# Patient Record
Sex: Female | Born: 1995
Health system: Southern US, Community
[De-identification: ages and names within clinical notes are randomized; demographics above are authoritative.]

## PROBLEM LIST (undated history)

## (undated) DIAGNOSIS — M419 Scoliosis, unspecified: Secondary | ICD-10-CM

## (undated) DIAGNOSIS — K219 Gastro-esophageal reflux disease without esophagitis: Secondary | ICD-10-CM

## (undated) DIAGNOSIS — G43019 Migraine without aura, intractable, without status migrainosus: Secondary | ICD-10-CM

## (undated) DIAGNOSIS — J45909 Unspecified asthma, uncomplicated: Secondary | ICD-10-CM

## (undated) DIAGNOSIS — F319 Bipolar disorder, unspecified: Secondary | ICD-10-CM

## (undated) DIAGNOSIS — M1990 Unspecified osteoarthritis, unspecified site: Secondary | ICD-10-CM

## (undated) DIAGNOSIS — E538 Deficiency of other specified B group vitamins: Secondary | ICD-10-CM

## (undated) DIAGNOSIS — F32A Depression, unspecified: Secondary | ICD-10-CM

## (undated) DIAGNOSIS — F329 Major depressive disorder, single episode, unspecified: Secondary | ICD-10-CM

## (undated) DIAGNOSIS — K5090 Crohn's disease, unspecified, without complications: Secondary | ICD-10-CM

## (undated) DIAGNOSIS — F4310 Post-traumatic stress disorder, unspecified: Secondary | ICD-10-CM

## (undated) DIAGNOSIS — R55 Syncope and collapse: Secondary | ICD-10-CM

## (undated) DIAGNOSIS — F419 Anxiety disorder, unspecified: Secondary | ICD-10-CM

## (undated) HISTORY — DX: Post-traumatic stress disorder, unspecified: F43.10

## (undated) HISTORY — PX: OTHER SURGICAL HISTORY: SHX169

## (undated) HISTORY — PX: CHOLECYSTECTOMY: SHX55

## (undated) HISTORY — DX: Gastro-esophageal reflux disease without esophagitis: K21.9

## (undated) HISTORY — DX: Anxiety disorder, unspecified: F41.9

## (undated) HISTORY — DX: Depression, unspecified: F32.A

## (undated) HISTORY — DX: Syncope and collapse: R55

## (undated) HISTORY — PX: TUMOR REMOVAL: SHX12

---
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
## 2014-10-14 ENCOUNTER — Emergency Department
Admission: EM | Admit: 2014-10-14 | Discharge: 2014-10-14 | Disposition: A | Discharge status: Home / Self Care | Payer: Self-pay | Attending: Emergency Medicine | Admitting: Emergency Medicine

## 2014-10-14 ENCOUNTER — Encounter: Payer: Self-pay | Admitting: Emergency Medicine

## 2014-10-14 DIAGNOSIS — Z720 Tobacco use: Secondary | ICD-10-CM | POA: Insufficient documentation

## 2014-10-14 DIAGNOSIS — J020 Streptococcal pharyngitis: Secondary | ICD-10-CM | POA: Insufficient documentation

## 2014-10-14 MED ORDER — PREDNISONE 20 MG PO TABS
ORAL_TABLET | ORAL | Status: AC
  Administered 2014-10-14: 60 mg via ORAL
  Filled 2014-10-14: qty 3

## 2014-10-14 MED ORDER — MAGIC MOUTHWASH
10.0000 mL | Freq: Once | ORAL | Status: AC
  Administered 2014-10-14: 10 mL via ORAL
  Filled 2014-10-14: qty 10

## 2014-10-14 MED ORDER — AMOXICILLIN-POT CLAVULANATE 875-125 MG PO TABS
ORAL_TABLET | ORAL | Status: AC
  Administered 2014-10-14: 1 via ORAL
  Filled 2014-10-14: qty 1

## 2014-10-14 MED ORDER — AMOXICILLIN-POT CLAVULANATE 875-125 MG PO TABS
1.0000 | ORAL_TABLET | Freq: Once | ORAL | Status: AC
  Administered 2014-10-14: 1 via ORAL
  Filled 2014-10-14: qty 1

## 2014-10-14 MED ORDER — PREDNISONE 20 MG PO TABS
60.0000 mg | ORAL_TABLET | Freq: Once | ORAL | Status: AC
  Administered 2014-10-14: 60 mg via ORAL

## 2014-10-14 MED ORDER — PREDNISONE 10 MG PO TABS: 10.0000 mg | ORAL_TABLET | Freq: Once | ORAL | Status: DC

## 2014-10-14 MED ORDER — AMOXICILLIN-POT CLAVULANATE 875-125 MG PO TABS: 1.0000 | ORAL_TABLET | Freq: Two times a day (BID) | ORAL | Status: AC

## 2014-10-14 MED ORDER — MAGIC MOUTHWASH: 10.0000 mL | Freq: Three times a day (TID) | ORAL | Status: DC | PRN

## 2014-10-14 NOTE — ED Notes (Signed)
Pt arrived to the ED for complaints of "sore throat with white specs on it." Pt reports having a history of strep-throat. Pt is AOx4 in no apparent distress.

## 2014-10-14 NOTE — ED Provider Notes (Signed)
[facility] Emergency Department Provider Note     Time seen: ----------------------------------------- 8:17 PM on 10/14/2014 -----------------------------------------    I have reviewed the triage vital signs and the nursing notes.   HISTORY  Chief Complaint Sore Throat    HPI Kaitlin Nelson is a 19 y.o. female who presents to ER with complaints of sore throat with white specks on it for the past 3 days.Patient states she has had a fever, and it is too painful to swallow. She denies any other complaints.   Past Medical History  Diagnosis Date  . Strep throat     There are no active problems to display for this patient.   Past Surgical History  Procedure Laterality Date  . Cholecystectomy      Allergies Hydrocil  Social History Social History  Substance Use Topics  . Smoking status: Current Every Day Smoker -- 0.50 packs/day for 3 years    Types: Cigarettes  . Smokeless tobacco: None  . Alcohol Use: No    Review of Systems Constitutional: Positive for recent fever Eyes: Negative for visual changes. ENT: Positive for sore throat Cardiovascular: Negative for chest pain. Respiratory: Negative for shortness of breath. Gastrointestinal: Negative for abdominal pain, vomiting and diarrhea.   ____________________________________________   PHYSICAL EXAM:  VITAL SIGNS: ED Triage Vitals  Enc Vitals Group     BP 10/14/14 2010 119/69 mmHg     Pulse Rate 10/14/14 2010 97     Resp 10/14/14 2010 18     Temp 10/14/14 2010 99 F (37.2 C)     Temp Source 10/14/14 2010 Oral     SpO2 10/14/14 2010 100 %     Weight 10/14/14 2010 155 lb (70.308 kg)     Height 10/14/14 2010 5' 3"  (1.6 m)     Head Cir --      Peak Flow --      Pain Score 10/14/14 2011 7     Pain Loc --      Pain Edu? --      Excl. in [place]? --     Constitutional: Alert and oriented. Well appearing and in no distress. Eyes: Conjunctivae are normal. PERRL. Normal  extraocular movements. ENT   Head: Normocephalic and atraumatic.   Nose: No congestion/rhinnorhea.   Mouth/Throat: Moderate tonsillar hypertrophy with erythema and exudate.   Neck: Mild anterior cervical adenopathy Cardiovascular: Normal rate, regular rhythm. Normal and symmetric distal pulses are present in all extremities. No murmurs, rubs, or gallops. Respiratory: Normal respiratory effort without tachypnea nor retractions. Breath sounds are clear and equal bilaterally. No wheezes/rales/rhonchi. Neurologic:  Normal speech and language.  Skin:  Skin is warm, dry and intact. No rash noted. Psychiatric: Mood and affect are normal.  ____________________________________________  ED COURSE:  Pertinent labs & imaging results that were available during my care of the patient were reviewed by me and considered in my medical decision making (see chart for details). Patient clinically with strep pharyngitis. Will be given antibiotics and steroids and Magic mouthwash. ____________________________________________  FINAL ASSESSMENT AND PLAN  Strep pharyngitis  Plan: Patient with labs and imaging as dictated above. Patient being treated as dictated above. She is stable for follow-up as needed   Earleen Newport, MD   Earleen Newport, MD 10/14/14 2028

## 2014-10-14 NOTE — ED Notes (Signed)
MD at bedside. 

## 2014-10-14 NOTE — Discharge Instructions (Signed)

## 2016-12-03 ENCOUNTER — Encounter: Payer: Self-pay | Admitting: Emergency Medicine

## 2016-12-03 ENCOUNTER — Emergency Department
Admission: EM | Admit: 2016-12-03 | Discharge: 2016-12-03 | Disposition: A | Discharge status: Home / Self Care | Payer: Self-pay | Attending: Emergency Medicine | Admitting: Emergency Medicine

## 2016-12-03 ENCOUNTER — Emergency Department: Payer: Self-pay

## 2016-12-03 DIAGNOSIS — J40 Bronchitis, not specified as acute or chronic: Secondary | ICD-10-CM

## 2016-12-03 DIAGNOSIS — F17210 Nicotine dependence, cigarettes, uncomplicated: Secondary | ICD-10-CM | POA: Insufficient documentation

## 2016-12-03 DIAGNOSIS — R05 Cough: Secondary | ICD-10-CM

## 2016-12-03 DIAGNOSIS — R062 Wheezing: Secondary | ICD-10-CM | POA: Insufficient documentation

## 2016-12-03 DIAGNOSIS — J209 Acute bronchitis, unspecified: Secondary | ICD-10-CM | POA: Insufficient documentation

## 2016-12-03 DIAGNOSIS — R059 Cough, unspecified: Secondary | ICD-10-CM

## 2016-12-03 MED ORDER — ALBUTEROL SULFATE (2.5 MG/3ML) 0.083% IN NEBU
2.5000 mg | INHALATION_SOLUTION | Freq: Once | RESPIRATORY_TRACT | Status: AC
  Administered 2016-12-03: 2.5 mg via RESPIRATORY_TRACT
  Filled 2016-12-03: qty 3

## 2016-12-03 MED ORDER — GUAIFENESIN-CODEINE 100-10 MG/5ML PO SOLN: 10.0000 mL | Freq: Four times a day (QID) | ORAL | 0 refills | Status: DC | PRN

## 2016-12-03 MED ORDER — PREDNISONE 10 MG (21) PO TBPK: ORAL_TABLET | ORAL | 0 refills | Status: DC

## 2016-12-03 MED ORDER — DEXAMETHASONE SODIUM PHOSPHATE 10 MG/ML IJ SOLN
10.0000 mg | Freq: Once | INTRAMUSCULAR | Status: AC
  Administered 2016-12-03: 10 mg via INTRAMUSCULAR
  Filled 2016-12-03: qty 1

## 2016-12-03 MED ORDER — AZITHROMYCIN 250 MG PO TABS: ORAL_TABLET | ORAL | 0 refills | Status: AC

## 2016-12-03 NOTE — Discharge Instructions (Signed)
Take medication as prescribed. Return to emergency department if symptoms worsen and follow-up with PCP as needed.

## 2016-12-03 NOTE — ED Notes (Signed)
Pt presents with cough, nasal congestion, fever x 3 weeks. She has taken dayquil with no relief. Pt states her cough is productive with green-ish sputum. NAD noted.

## 2016-12-03 NOTE — ED Provider Notes (Signed)
[facility] Emergency Department Provider Note   ____________________________________________   I have reviewed the triage vital signs and the nursing notes.   HISTORY  Chief Complaint Cough; Fever; and Nasal Congestion    HPI Kaitlin Nelson is a 21 y.o. female presents emergency department with cough, nasal congestion, malaise, fever, chills, sinus pressure for approximately 3 weeks. Patient has attempted several over-the-counter medications without any relief or resolution of symptoms. Patient has missed several days or due to current symptoms and has decided to come to the emergency department for evaluation. Patient has a history of asthma however, she has not experienced an asthma attack in very long time. Patient works outside [employer] which occasionally can be an aggravating factor for her respiratory system. Patient denies headache, vision changes, chest pain, chest tightness, abdominal pain, nausea and vomiting.  Past Medical History:  Diagnosis Date  . Strep throat     There are no active problems to display for this patient.   Past Surgical History:  Procedure Laterality Date  . CHOLECYSTECTOMY      Prior to Admission medications   Medication Sig Start Date End Date Taking? Authorizing Provider  Alum & Mag Hydroxide-Simeth (MAGIC MOUTHWASH) SOLN Take 10 mLs by mouth 3 (three) times daily as needed for mouth pain. 10/14/14   Earleen Newport, MD  azithromycin (ZITHROMAX Z-PAK) 250 MG tablet Take 2 tablets (500 mg) on  Day 1,  followed by 1 tablet (250 mg) once daily on Days 2 through 5. 12/03/16 12/08/16  Dolan Xia M, PA-C  guaiFENesin-codeine 100-10 MG/5ML syrup Take 10 mLs by mouth every 6 (six) hours as needed for cough. 12/03/16   Aaima Gaddie M, PA-C  predniSONE (STERAPRED UNI-PAK 21 TAB) 10 MG (21) TBPK tablet Take 6 tablets on day 1. Take 5 tablets on day 2. Take 4 tablets on day 3. Take 3  tablets on day 4. Take 2 tablets on day 5. Take 1 tablets on day 6. 12/03/16   Symphony Demuro M, PA-C    Allergies Hydrocil [psyllium]  No family history on file.  Social History Social History  Substance Use Topics  . Smoking status: Current Every Day Smoker    Packs/day: 0.50    Years: 3.00    Types: Cigarettes  . Smokeless tobacco: Never Used  . Alcohol use No    Review of Systems Constitutional: Positive for fever/chills. Malaise Eyes: No visual changes. ENT:  Negative for sore throat and for difficulty swallowing. Nasal congestion, sinus pressure. Cardiovascular: Denies chest pain. Respiratory: Positive for productive cough and exertional shortness of breath. Wheezing Musculoskeletal: Negative for back pain. Skin: Negative for rash. Neurological: Negative for headaches.  ____________________________________________   PHYSICAL EXAM:  VITAL SIGNS: ED Triage Vitals  Enc Vitals Group     BP 12/03/16 1323 113/69     Pulse Rate 12/03/16 1323 100     Resp 12/03/16 1323 16     Temp 12/03/16 1323 98.4 F (36.9 C)     Temp Source 12/03/16 1323 Oral     SpO2 12/03/16 1323 98 %     Weight 12/03/16 1324 200 lb (90.7 kg)     Height 12/03/16 1324 5' 4"  (1.626 m)     Head Circumference --      Peak Flow --      Pain Score 12/03/16 1326 0     Pain Loc --      Pain Edu? --  Excl. in [place]? --     Constitutional: Alert and oriented. Ill appearing and in no mild distress.  Eyes: Conjunctivae are normal. PERRL. EOMI  Head: Normocephalic and atraumatic. ENT:      Ears: Canals clear. TMs intact bilaterally.      Nose: Congestion/rhinnorhea.      Mouth/Throat: Mucous membranes are moist. Oropharynx mild erythema without edema Tonsils symmetrical bilaterally. Uvula midline. Neck:Supple. No thyromegaly. No stridor.  Respiratory: Normal respiratory effort without tachypnea or retractions. Lungs CTAB. Wheezes base of the left lower lobe. Good air entry to the bases with no  decreased or absent breath sounds. Productive cough. Hematological/Lymphatic/Immunological: No cervical lymphadenopathy. Cardiovascular: Normal rate, regular rhythm. Normal distal pulses. Gastrointestinal: Bowel sounds 4 quadrants. . Neurologic: Normal speech and language. Skin:  Skin is warm, dry and intact. No rash noted. Psychiatric: Mood and affect are normal. Speech and behavior are normal. Patient exhibits appropriate insight and judgement.  ____________________________________________   LABS (all labs ordered are listed, but only abnormal results are displayed)  Labs Reviewed - No data to display ____________________________________________  EKG None ____________________________________________  RADIOLOGY DG chest 2 view FINDINGS: The heart size and mediastinal contours are within normal limits. Both lungs are clear except for slight peribronchial thickening. Bilateral cervical ribs, tiny on the left and small on the right.  IMPRESSION: Mild bronchitic changes. ____________________________________________   PROCEDURES  Procedure(s) performed: no    Critical Care performed: no ____________________________________________   INITIAL IMPRESSION / ASSESSMENT AND PLAN / ED COURSE  Pertinent labs & imaging results that were available during my care of the patient were reviewed by me and considered in my medical decision making (see chart for details).   Patient presents to the emergency department productive cough, wheezing, nasal congestion, sinus pressure, rhinorrhea, and fever. History, physical exam and imaging are reassuring symptoms are consistent with bronchitis.  Patient will be prescribed azithromycin for antibiotic coverage. Patient will also be prescribed a prednisone taper and guaifenesin with codeine for cough. Highly recommended patient to hydrate and rest. Physical exam and vital signes were reassuring at this time. Patient informed of clinical course,  understand medical decision-making process, and agree with plan.  Patient was advised to follow up with PCP as needed and was also advised to return to the emergency department for symptoms that change or worsen.     ____________________________________________   FINAL CLINICAL IMPRESSION(S) / ED DIAGNOSES  Final diagnoses:  Bronchitis  Cough  Wheezing       NEW MEDICATIONS STARTED DURING THIS VISIT:  Discharge Medication List as of 12/03/2016  4:02 PM    START taking these medications   Details  azithromycin (ZITHROMAX Z-PAK) 250 MG tablet Take 2 tablets (500 mg) on  Day 1,  followed by 1 tablet (250 mg) once daily on Days 2 through 5., Print    guaiFENesin-codeine 100-10 MG/5ML syrup Take 10 mLs by mouth every 6 (six) hours as needed for cough., Starting Mon 12/03/2016, Print    predniSONE (STERAPRED UNI-PAK 21 TAB) 10 MG (21) TBPK tablet Take 6 tablets on day 1. Take 5 tablets on day 2. Take 4 tablets on day 3. Take 3 tablets on day 4. Take 2 tablets on day 5. Take 1 tablets on day 6., Print         Note:  This document was prepared using Dragon voice recognition software and may include unintentional dictation errors.    Alric Quan 12/03/16 2029    Harvest Dark, MD 12/04/16  1640  

## 2016-12-03 NOTE — ED Notes (Signed)
Pt discharged home after verbalizing understanding of discharge instructions; nad noted. 

## 2016-12-03 NOTE — ED Triage Notes (Signed)
Pt with cough, congestion, fever and sore throat for three weeks.

## 2017-03-10 ENCOUNTER — Emergency Department (HOSPITAL_COMMUNITY)
Admission: EM | Admit: 2017-03-10 | Discharge: 2017-03-10 | Disposition: A | Discharge status: Home / Self Care | Payer: Self-pay | Attending: Emergency Medicine | Admitting: Emergency Medicine

## 2017-03-10 ENCOUNTER — Other Ambulatory Visit: Payer: Self-pay

## 2017-03-10 ENCOUNTER — Encounter (HOSPITAL_COMMUNITY): Payer: Self-pay | Admitting: Emergency Medicine

## 2017-03-10 DIAGNOSIS — R112 Nausea with vomiting, unspecified: Secondary | ICD-10-CM | POA: Insufficient documentation

## 2017-03-10 DIAGNOSIS — F17210 Nicotine dependence, cigarettes, uncomplicated: Secondary | ICD-10-CM | POA: Insufficient documentation

## 2017-03-10 DIAGNOSIS — Z79899 Other long term (current) drug therapy: Secondary | ICD-10-CM | POA: Insufficient documentation

## 2017-03-10 HISTORY — DX: Crohn's disease, unspecified, without complications: K50.90

## 2017-03-10 MED ORDER — ONDANSETRON HCL 4 MG PO TABS: 4.0000 mg | ORAL_TABLET | Freq: Three times a day (TID) | ORAL | 0 refills | Status: DC | PRN

## 2017-03-10 MED ORDER — ONDANSETRON 4 MG PO TBDP
4.0000 mg | ORAL_TABLET | Freq: Once | ORAL | Status: AC
  Administered 2017-03-10: 4 mg via ORAL
  Filled 2017-03-10: qty 1

## 2017-03-10 NOTE — Discharge Instructions (Signed)
Swallow one tablet of Zofran once every 8 hours as needed for nausea and vomiting. It is important to stay hydrated by drinking lots of fluids.   If you develop new or worsening symptoms, occluding a fever that does not improve with Tylenol, severe abdominal pain, lime green, bloody, or projectile vomiting, or blood in your diarrhea, or other concerning symptoms, please return to the emergency department for re-evaluation.

## 2017-03-10 NOTE — ED Triage Notes (Signed)
Pt states she has been vomiting for 2 days and had a fever yesterday  Denies fever today and states the last time she vomiting was just before coming here   Pt denies pain, just sore from vomiting

## 2017-03-10 NOTE — ED Provider Notes (Signed)
[facility] DEPT Provider Note   CSN: [id number] Arrival date & time: 03/10/17  2113     History   Chief Complaint Chief Complaint  Patient presents with  . Emesis    HPI Kaitlin Nelson is a 22 y.o. female with history of Crohn's disease who presents to the emergency department with a chief complaint of NBNB emesis.  She reports 4 episodes of emesis over the last 2 days.  Last episode of emesis just prior to arrival in the ED.  She reports an associated nausea and fever, T-max 101, yesterday, but has been afebrile today. She reports mild generalized abdominal pain yesterday, which has since resolved.  She denies chills, myalgias, headache, rash, back pain, dysuria, hematuria, diarrhea, melena, hematochezia, vaginal pain or discharge.  No aggravating or alleviating factors. She reports minimal alcohol use recently. She states "I might have food poisoning."  No known sick contacts.  She does not feel like this is a flare of her Crohn's disease.  She treated her fever yesterday with Tylenol.  She states I called out of work, and I cannot return until I have a work note."  LMP 02/20/17. Last BM was 2 days ago. She reports that she is not currently sexually active.   The history is provided by the patient. No language interpreter was used.    Past Medical History:  Diagnosis Date  . Crohn disease ([place])   . Strep throat     There are no active problems to display for this patient.   Past Surgical History:  Procedure Laterality Date  . CHOLECYSTECTOMY    . tumor removed from neck      OB History    Gravida Para Term Preterm AB Living   0 0 0 0 0 0   SAB TAB Ectopic Multiple Live Births   0 0 0 0         Home Medications    Prior to Admission medications   Medication Sig Start Date End Date Taking? Authorizing Provider  Alum & Mag Hydroxide-Simeth (MAGIC MOUTHWASH) SOLN Take 10 mLs by mouth 3 (three) times daily as needed for mouth pain.  10/14/14   Earleen Newport, MD  guaiFENesin-codeine 100-10 MG/5ML syrup Take 10 mLs by mouth every 6 (six) hours as needed for cough. 12/03/16   Little, Traci M, PA-C  ondansetron (ZOFRAN) 4 MG tablet Take 1 tablet (4 mg total) by mouth every 8 (eight) hours as needed for nausea or vomiting. 03/10/17   Ayssa Bentivegna A, PA-C  predniSONE (STERAPRED UNI-PAK 21 TAB) 10 MG (21) TBPK tablet Take 6 tablets on day 1. Take 5 tablets on day 2. Take 4 tablets on day 3. Take 3 tablets on day 4. Take 2 tablets on day 5. Take 1 tablets on day 6. 12/03/16   Little, Traci M, PA-C    Family History Family History  Problem Relation Age of Onset  . Hypertension Mother     Social History Social History   Tobacco Use  . Smoking status: Current Every Day Smoker    Packs/day: 0.50    Years: 3.00    Pack years: 1.50    Types: Cigarettes  . Smokeless tobacco: Never Used  Substance Use Topics  . Alcohol use: Yes    Comment: rare  . Drug use: Yes    Types: Marijuana     Allergies   Hydrocil [psyllium] and Hydrocodone   Review of Systems Review of Systems  Constitutional: Negative for  activity change.  Respiratory: Negative for shortness of breath.   Cardiovascular: Negative for chest pain.  Gastrointestinal: Positive for abdominal pain (resolved), nausea and vomiting. Negative for diarrhea.  Musculoskeletal: Negative for back pain.  Skin: Negative for rash.   Physical Exam Updated Vital Signs BP 110/80 (BP Location: Right Arm)   Pulse 65   Temp 98.3 F (36.8 C) (Oral)   Resp 18   LMP 02/20/2017 (Approximate)   SpO2 100%   Physical Exam  Constitutional: She is oriented to person, place, and time. She appears well-developed and well-nourished. No distress.  Non-toxic and well appearing.    HENT:  Head: Normocephalic.  Mucous membranes are moist.  Eyes: Conjunctivae are normal. No scleral icterus.  Neck: Normal range of motion. Neck supple.  Cardiovascular: Normal rate, regular  rhythm, normal heart sounds and intact distal pulses. Exam reveals no gallop and no friction rub.  No murmur heard. Pulmonary/Chest: Effort normal and breath sounds normal. No stridor. No respiratory distress. She has no wheezes. She has no rales. She exhibits no tenderness.  Abdominal: Soft. Bowel sounds are normal. She exhibits no distension and no mass. There is no tenderness. There is no rebound and no guarding. No hernia.  Normoactive bowel sounds.  Abdomen is soft, nontender, nondistended.  No peritoneal signs. No CVA tenderness bilaterally. Negative Murphy's sign. No tenderness over McBurney's point.   Musculoskeletal: Normal range of motion. She exhibits no edema, tenderness or deformity.  Neurological: She is alert and oriented to person, place, and time.  Skin: Skin is warm. Capillary refill takes less than 2 seconds. No rash noted. She is not diaphoretic. No erythema. No pallor.  Psychiatric: Her behavior is normal.  Nursing note and vitals reviewed.   ED Treatments / Results  Labs (all labs ordered are listed, but only abnormal results are displayed) Labs Reviewed - No data to display  EKG  EKG Interpretation None       Radiology No results found.  Procedures Procedures (including critical care time)  Medications Ordered in ED Medications  ondansetron (ZOFRAN-ODT) disintegrating tablet 4 mg (4 mg Oral Given 03/10/17 2223)     Initial Impression / Assessment and Plan / ED Course  I have reviewed the triage vital signs and the nursing notes.  Pertinent labs & imaging results that were available during my care of the patient were reviewed by me and considered in my medical decision making (see chart for details).     22 year old female with history of Crohn's disease presenting with nonbloody nonbilious emesis, fever, and abdominal pain for 2 days.  Fever and abdominal pain resolved yesterday.  She does not feel like this is a flare of her Crohn's. She is declining  blood work and just wants a note to return to work. She is nontoxic and well-appearing. vital signs are reassuring.  On physical exam, abdomen is soft, non-tender, non-distended. No signs of dehydration.  She does not appear to have a surgical abdomen. Zofran given in the ED.  She was successfully fluid challenged. Will d/c the patient to home with Zofran and work note. Strict return precautions.  The patient appears reliable. No acute distress.  The patient is safe for discharge at this time.  Final Clinical Impressions(s) / ED Diagnoses   Final diagnoses:  Non-intractable vomiting with nausea, unspecified vomiting type    ED Discharge Orders        Ordered    ondansetron (ZOFRAN) 4 MG tablet  Every 8 hours PRN  03/10/17 2257       Joline Maxcy A, PA-C 03/10/17 2311    Lajean Saver, MD 03/15/17 1234

## 2017-03-10 NOTE — ED Notes (Signed)
Pt took fluids will no difficulty

## 2017-03-26 ENCOUNTER — Emergency Department (HOSPITAL_COMMUNITY)
Admission: EM | Admit: 2017-03-26 | Discharge: 2017-03-26 | Disposition: A | Discharge status: Home / Self Care | Payer: Self-pay | Attending: Emergency Medicine | Admitting: Emergency Medicine

## 2017-03-26 ENCOUNTER — Encounter (HOSPITAL_COMMUNITY): Payer: Self-pay | Admitting: *Deleted

## 2017-03-26 ENCOUNTER — Other Ambulatory Visit: Payer: Self-pay

## 2017-03-26 DIAGNOSIS — W57XXXA Bitten or stung by nonvenomous insect and other nonvenomous arthropods, initial encounter: Secondary | ICD-10-CM | POA: Insufficient documentation

## 2017-03-26 DIAGNOSIS — Z79899 Other long term (current) drug therapy: Secondary | ICD-10-CM | POA: Insufficient documentation

## 2017-03-26 DIAGNOSIS — M79632 Pain in left forearm: Secondary | ICD-10-CM | POA: Insufficient documentation

## 2017-03-26 DIAGNOSIS — Z23 Encounter for immunization: Secondary | ICD-10-CM | POA: Insufficient documentation

## 2017-03-26 DIAGNOSIS — F17210 Nicotine dependence, cigarettes, uncomplicated: Secondary | ICD-10-CM | POA: Insufficient documentation

## 2017-03-26 HISTORY — DX: Scoliosis, unspecified: M41.9

## 2017-03-26 HISTORY — DX: Unspecified osteoarthritis, unspecified site: M19.90

## 2017-03-26 MED ORDER — TETANUS-DIPHTH-ACELL PERTUSSIS 5-2.5-18.5 LF-MCG/0.5 IM SUSP
0.5000 mL | Freq: Once | INTRAMUSCULAR | Status: AC
  Administered 2017-03-26: 0.5 mL via INTRAMUSCULAR
  Filled 2017-03-26: qty 0.5

## 2017-03-26 NOTE — ED Provider Notes (Signed)
[facility] DEPT Provider Note   CSN: [id number] Arrival date & time: 03/26/17  1436     History   Chief Complaint Chief Complaint  Patient presents with  . Insect Bite    HPI Kaitlin Nelson is a 22 y.o. female.  This is a 22 year old female presents with rash and swelling to her left forearm which began today after she was stung by an insect.  Denies any fever or chills.  Symptoms are worse when she scratches it.  No treatment used prior to arrival nothing makes her symptoms better.      Past Medical History:  Diagnosis Date  . Crohn disease ([place])   . Strep throat     There are no active problems to display for this patient.   Past Surgical History:  Procedure Laterality Date  . CHOLECYSTECTOMY    . tumor removed from neck      OB History    Gravida Para Term Preterm AB Living   0 0 0 0 0 0   SAB TAB Ectopic Multiple Live Births   0 0 0 0         Home Medications    Prior to Admission medications   Medication Sig Start Date End Date Taking? Authorizing Provider  Alum & Mag Hydroxide-Simeth (MAGIC MOUTHWASH) SOLN Take 10 mLs by mouth 3 (three) times daily as needed for mouth pain. 10/14/14   Earleen Newport, MD  guaiFENesin-codeine 100-10 MG/5ML syrup Take 10 mLs by mouth every 6 (six) hours as needed for cough. 12/03/16   Little, Traci M, PA-C  ondansetron (ZOFRAN) 4 MG tablet Take 1 tablet (4 mg total) by mouth every 8 (eight) hours as needed for nausea or vomiting. 03/10/17   McDonald, Mia A, PA-C  predniSONE (STERAPRED UNI-PAK 21 TAB) 10 MG (21) TBPK tablet Take 6 tablets on day 1. Take 5 tablets on day 2. Take 4 tablets on day 3. Take 3 tablets on day 4. Take 2 tablets on day 5. Take 1 tablets on day 6. 12/03/16   Little, Traci M, PA-C    Family History Family History  Problem Relation Age of Onset  . Hypertension Mother     Social History Social History   Tobacco Use  . Smoking status: Current Every Day Smoker      Packs/day: 0.50    Years: 3.00    Pack years: 1.50    Types: Cigarettes  . Smokeless tobacco: Never Used  Substance Use Topics  . Alcohol use: Yes    Comment: rare  . Drug use: Yes    Types: Marijuana     Allergies   Hydrocil [psyllium] and Hydrocodone   Review of Systems Review of Systems  All other systems reviewed and are negative.    Physical Exam Updated Vital Signs BP 122/67 (BP Location: Right Arm)   Pulse 71   Temp 98.9 F (37.2 C) (Oral)   Resp 16   Ht 1.613 m (5' 3.5")   Wt 91.6 kg (202 lb)   LMP 03/19/2017   SpO2 98%   BMI 35.22 kg/m   Physical Exam  Constitutional: She is oriented to person, place, and time. She appears well-developed and well-nourished.  Non-toxic appearance.  HENT:  Head: Normocephalic and atraumatic.  Eyes: Conjunctivae are normal. Pupils are equal, round, and reactive to light.  Neck: Normal range of motion.  Cardiovascular: Normal rate.  Pulmonary/Chest: Effort normal.  Musculoskeletal:       Arms: Neurological: She  is alert and oriented to person, place, and time.  Skin: Skin is warm and dry.  Psychiatric: She has a normal mood and affect.  Nursing note and vitals reviewed.    ED Treatments / Results  Labs (all labs ordered are listed, but only abnormal results are displayed) Labs Reviewed - No data to display  EKG  EKG Interpretation None       Radiology No results found.  Procedures Procedures (including critical care time)  Medications Ordered in ED Medications - No data to display   Initial Impression / Assessment and Plan / ED Course  I have reviewed the triage vital signs and the nursing notes.  Pertinent labs & imaging results that were available during my care of the patient were reviewed by me and considered in my medical decision making (see chart for details).    Patient with localized reaction to insect sting.  Supportive measures related to patient  Final Clinical Impressions(s) /  ED Diagnoses   Final diagnoses:  None    ED Discharge Orders    None       Lacretia Leigh, MD 03/26/17 1610

## 2017-03-26 NOTE — Discharge Instructions (Signed)
Use benadryl as directed Return here for fever

## 2017-03-26 NOTE — ED Triage Notes (Signed)
Pt complains of pain and redness to her anterior forearm. Pt states she initially had a red welt this morning but has spread and become more swollen and painful.

## 2017-03-26 NOTE — ED Triage Notes (Signed)
Patient c/o an insect bite to the left anterior forearm. Patient denies any fever. Patient states she woke up scratching her arm in her sleep.

## 2017-03-30 ENCOUNTER — Encounter (HOSPITAL_COMMUNITY): Payer: Self-pay | Admitting: Emergency Medicine

## 2017-03-30 ENCOUNTER — Other Ambulatory Visit: Payer: Self-pay

## 2017-03-30 ENCOUNTER — Emergency Department (HOSPITAL_COMMUNITY)
Admission: EM | Admit: 2017-03-30 | Discharge: 2017-03-30 | Disposition: A | Discharge status: Home / Self Care | Payer: Self-pay | Attending: Emergency Medicine | Admitting: Emergency Medicine

## 2017-03-30 DIAGNOSIS — R1310 Dysphagia, unspecified: Secondary | ICD-10-CM | POA: Insufficient documentation

## 2017-03-30 DIAGNOSIS — Z87891 Personal history of nicotine dependence: Secondary | ICD-10-CM | POA: Insufficient documentation

## 2017-03-30 DIAGNOSIS — R221 Localized swelling, mass and lump, neck: Secondary | ICD-10-CM | POA: Insufficient documentation

## 2017-03-30 MED ORDER — CEPHALEXIN 250 MG PO CAPS: 250.0000 mg | ORAL_CAPSULE | Freq: Four times a day (QID) | ORAL | 0 refills | Status: DC

## 2017-03-30 NOTE — ED Triage Notes (Signed)
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

## 2017-03-30 NOTE — ED Provider Notes (Signed)
[facility] DEPT Provider Note   CSN: [id number] Arrival date & time: 03/30/17  2214     History   Chief Complaint Chief Complaint  Patient presents with  . Dysphagia    HPI Kaitlin Nelson is a 22 y.o. female.  She has had a slight discomfort with swallowing, described as "a click," in the left upper neck for several months.  Several days ago she noted some swelling in this region.  She denies pain, inability to swallow, nausea, fever, chills, weakness or dizziness.  She had surgery on the right neck, at age 71 for unknown mass, but did not require ongoing follow-up.  There are no other known modifying factors.  HPI  Past Medical History:  Diagnosis Date  . Arthritis   . Crohn disease ([place])   . Esophagitis   . Scoliosis   . Strep throat     There are no active problems to display for this patient.   Past Surgical History:  Procedure Laterality Date  . CHOLECYSTECTOMY    . TUMOR REMOVAL    . tumor removed from neck      OB History    Gravida Para Term Preterm AB Living   0 0 0 0 0 0   SAB TAB Ectopic Multiple Live Births   0 0 0 0         Home Medications    Prior to Admission medications   Medication Sig Start Date End Date Taking? Authorizing Provider  Alum & Mag Hydroxide-Simeth (MAGIC MOUTHWASH) SOLN Take 10 mLs by mouth 3 (three) times daily as needed for mouth pain. 10/14/14   Earleen Newport, MD  cephALEXin (KEFLEX) 250 MG capsule Take 1 capsule (250 mg total) by mouth 4 (four) times daily. 03/30/17   Daleen Bo, MD  guaiFENesin-codeine 100-10 MG/5ML syrup Take 10 mLs by mouth every 6 (six) hours as needed for cough. 12/03/16   Little, Traci M, PA-C  ondansetron (ZOFRAN) 4 MG tablet Take 1 tablet (4 mg total) by mouth every 8 (eight) hours as needed for nausea or vomiting. 03/10/17   McDonald, Mia A, PA-C  predniSONE (STERAPRED UNI-PAK 21 TAB) 10 MG (21) TBPK tablet Take 6 tablets on day 1. Take 5 tablets on day 2.  Take 4 tablets on day 3. Take 3 tablets on day 4. Take 2 tablets on day 5. Take 1 tablets on day 6. 12/03/16   Little, Traci M, PA-C    Family History Family History  Problem Relation Age of Onset  . Hypertension Mother     Social History Social History   Tobacco Use  . Smoking status: Former Smoker    Packs/day: 0.50    Years: 3.00    Pack years: 1.50    Types: Cigarettes  . Smokeless tobacco: Never Used  Substance Use Topics  . Alcohol use: Yes    Comment: rare  . Drug use: Yes    Types: Marijuana     Allergies   Hydrocil [psyllium] and Hydrocodone   Review of Systems Review of Systems  All other systems reviewed and are negative.    Physical Exam Updated Vital Signs BP 105/87   Pulse (!) 59   Temp 98.1 F (36.7 C) (Oral)   Resp 18   Ht 5' 3"  (1.6 m)   Wt 88.5 kg (195 lb)   LMP 03/19/2017   SpO2 99%   BMI 34.54 kg/m   Physical Exam  Constitutional: She is oriented to person, place,  and time. She appears well-developed and well-nourished. No distress.  HENT:  Head: Normocephalic and atraumatic.  Eyes: Conjunctivae and EOM are normal. Pupils are equal, round, and reactive to light.  Neck: Normal range of motion and phonation normal. Neck supple.  Small slightly tender mass, adjacent to the left upper tracheal region, measuring approximately 2 x 2 cm.  This mass seems somewhat mobile.  There is no overlying skin change.  She does not have pain with swallowing.  Cardiovascular: Normal rate.  Pulmonary/Chest: Effort normal.  Musculoskeletal: Normal range of motion.  Neurological: She is alert and oriented to person, place, and time. She exhibits normal muscle tone.  Skin: Skin is warm and dry.  Psychiatric: She has a normal mood and affect. Her behavior is normal. Judgment and thought content normal.  Nursing note and vitals reviewed.    ED Treatments / Results  Labs (all labs ordered are listed, but only abnormal results are displayed) Labs Reviewed  - No data to display  EKG  EKG Interpretation None       Radiology No results found.  Procedures Procedures (including critical care time)  Medications Ordered in ED Medications - No data to display   Initial Impression / Assessment and Plan / ED Course  I have reviewed the triage vital signs and the nursing notes.  Pertinent labs & imaging results that were available during my care of the patient were reviewed by me and considered in my medical decision making (see chart for details).      Patient Vitals for the past 24 hrs:  BP Temp Temp src Pulse Resp SpO2 Height Weight  03/30/17 2221 105/87 98.1 F (36.7 C) Oral (!) 59 18 99 % 5' 3"  (1.6 m) 88.5 kg (195 lb)    10:48 PM Reevaluation with update and discussion. After initial assessment and treatment, an updated evaluation reveals no change in clinical status.  Findings discussed with patient, and friend, all questions answered. Daleen Bo      Final Clinical Impressions(s) / ED Diagnoses   Final diagnoses:  None   Mass/nodule, left neck, subacute, present for several months by her report.  She is noticing it more recently, therefore it will need to be addressed by specialty service, ENT.  Possible lymph gland infection therefore will cover with Keflex.  Patient instructed to follow-up in 1 week.  Nursing Notes Reviewed/ Care Coordinated Applicable Imaging Reviewed Interpretation of Laboratory Data incorporated into ED treatment  The patient appears reasonably screened and/or stabilized for discharge and I doubt any other medical condition or other [facility] requiring further screening, evaluation, or treatment in the ED at this time prior to discharge.  Plan: Home Medications-OTC analgesia as needed; Home Treatments-heat applications if needed; return here if the recommended treatment, does not improve the symptoms; Recommended follow up-ENT follow-up 1 week   ED Discharge Orders        Ordered    cephALEXin  (KEFLEX) 250 MG capsule  4 times daily     03/30/17 2241       Daleen Bo, MD 03/30/17 2249

## 2017-03-30 NOTE — Discharge Instructions (Signed)
We are prescribing an antibiotic in case you have a lymph node infection causing the swelling.  It is important to follow-up with the specialist for further evaluation and treatment, and possible biopsy or removal, of the mass.  You can try using heat on this area to help discomfort, or take Motrin or Tylenol for pain.

## 2017-05-14 DIAGNOSIS — Q892 Congenital malformations of other endocrine glands: Secondary | ICD-10-CM | POA: Diagnosis not present

## 2017-05-17 ENCOUNTER — Other Ambulatory Visit: Payer: Self-pay | Admitting: Otolaryngology

## 2017-05-17 DIAGNOSIS — Q892 Congenital malformations of other endocrine glands: Secondary | ICD-10-CM

## 2017-05-24 ENCOUNTER — Encounter (HOSPITAL_COMMUNITY): Payer: Self-pay | Admitting: *Deleted

## 2017-05-24 ENCOUNTER — Emergency Department (HOSPITAL_COMMUNITY)
Admission: EM | Admit: 2017-05-24 | Discharge: 2017-05-24 | Disposition: A | Discharge status: Home / Self Care | Payer: 59 | Attending: Emergency Medicine | Admitting: Emergency Medicine

## 2017-05-24 ENCOUNTER — Encounter (HOSPITAL_COMMUNITY): Payer: Self-pay | Admitting: Emergency Medicine

## 2017-05-24 ENCOUNTER — Other Ambulatory Visit: Payer: Self-pay

## 2017-05-24 ENCOUNTER — Ambulatory Visit (HOSPITAL_COMMUNITY)
Admission: EM | Admit: 2017-05-24 | Discharge: 2017-05-24 | Disposition: A | Discharge status: Home / Self Care | Payer: 59 | Attending: Family Medicine | Admitting: Family Medicine

## 2017-05-24 DIAGNOSIS — Z5321 Procedure and treatment not carried out due to patient leaving prior to being seen by health care provider: Secondary | ICD-10-CM | POA: Diagnosis not present

## 2017-05-24 DIAGNOSIS — R109 Unspecified abdominal pain: Secondary | ICD-10-CM | POA: Diagnosis not present

## 2017-05-24 DIAGNOSIS — R112 Nausea with vomiting, unspecified: Secondary | ICD-10-CM

## 2017-05-24 LAB — CBC
HCT: 40.5 % (ref 36.0–46.0)
HEMOGLOBIN: 13.2 g/dL (ref 12.0–15.0)
MCH: 29.6 pg (ref 26.0–34.0)
MCHC: 32.6 g/dL (ref 30.0–36.0)
MCV: 90.8 fL (ref 78.0–100.0)
Platelets: 231 10*3/uL (ref 150–400)
RBC: 4.46 MIL/uL (ref 3.87–5.11)
RDW: 13.4 % (ref 11.5–15.5)
WBC: 11.1 10*3/uL — ABNORMAL HIGH (ref 4.0–10.5)

## 2017-05-24 LAB — URINALYSIS, ROUTINE W REFLEX MICROSCOPIC
BILIRUBIN URINE: NEGATIVE
Glucose, UA: NEGATIVE mg/dL
Hgb urine dipstick: NEGATIVE
Ketones, ur: 20 mg/dL — AB
Leukocytes, UA: NEGATIVE
NITRITE: NEGATIVE
PROTEIN: NEGATIVE mg/dL
SPECIFIC GRAVITY, URINE: 1.028 (ref 1.005–1.030)
pH: 6 (ref 5.0–8.0)

## 2017-05-24 LAB — COMPREHENSIVE METABOLIC PANEL
ALK PHOS: 71 U/L (ref 38–126)
ALT: 27 U/L (ref 14–54)
ANION GAP: 11 (ref 5–15)
AST: 42 U/L — ABNORMAL HIGH (ref 15–41)
Albumin: 4.1 g/dL (ref 3.5–5.0)
BILIRUBIN TOTAL: 0.8 mg/dL (ref 0.3–1.2)
BUN: 20 mg/dL (ref 6–20)
CALCIUM: 8.8 mg/dL — AB (ref 8.9–10.3)
CO2: 21 mmol/L — ABNORMAL LOW (ref 22–32)
Chloride: 107 mmol/L (ref 101–111)
Creatinine, Ser: 0.69 mg/dL (ref 0.44–1.00)
GFR calc non Af Amer: >60 mL/min (ref >60)
Glucose, Bld: 94 mg/dL (ref 65–99)
Potassium: 4.4 mmol/L (ref 3.5–5.1)
Sodium: 139 mmol/L (ref 135–145)
TOTAL PROTEIN: 7.6 g/dL (ref 6.5–8.1)

## 2017-05-24 LAB — I-STAT BETA HCG BLOOD, ED (MC, WL, AP ONLY)

## 2017-05-24 LAB — LIPASE, BLOOD: Lipase: 20 U/L (ref 11–51)

## 2017-05-24 MED ORDER — ONDANSETRON HCL 4 MG PO TABS: 4.0000 mg | ORAL_TABLET | Freq: Four times a day (QID) | ORAL | 0 refills | Status: DC

## 2017-05-24 MED ORDER — ONDANSETRON 4 MG PO TBDP
4.0000 mg | ORAL_TABLET | Freq: Once | ORAL | Status: AC | PRN
  Administered 2017-05-24: 4 mg via ORAL
  Filled 2017-05-24: qty 1

## 2017-05-24 MED ORDER — ONDANSETRON HCL 4 MG/2ML IJ SOLN
4.0000 mg | Freq: Once | INTRAMUSCULAR | Status: AC
  Administered 2017-05-24: 4 mg via INTRAMUSCULAR

## 2017-05-24 MED ORDER — ONDANSETRON HCL 4 MG/2ML IJ SOLN
INTRAMUSCULAR | Status: AC
  Filled 2017-05-24: qty 2

## 2017-05-24 NOTE — ED Triage Notes (Signed)
Pt complains of nausea/emesis/abdominal pain since this morning. Pt denies diarrhea. Pt feels weak.

## 2017-05-24 NOTE — ED Triage Notes (Signed)
C/o vomiting onset this AM denies diarrhea. States went to [facility]  and was given a Zofran and pt left ER to come here due to their lengthy wait time

## 2017-05-24 NOTE — Discharge Instructions (Addendum)
You have GI bug most likely. Treat symtpoms with Zofran as needed. Sips of liquid and only bland crackers or toast ONLY when you can keep sips of liquids down. If you worsen f/u here in in the ED.

## 2017-05-24 NOTE — ED Provider Notes (Signed)
[facility]    CSN: [id number] Arrival date & time: 05/24/17  [place]     History   Chief Complaint Chief Complaint  Patient presents with  . Emesis    HPI Kaitlin Nelson is a 22 y.o. female.   Presents with emesis x 7 or 8 times today. No diarrhea. No abdominal pain or urinary symtpoms. No sore throat. [facility] keep that [facility] so she left and came here due to wait time.      Past Medical History:  Diagnosis Date  . Arthritis   . Crohn disease ([place]   . Esophagitis   . Scoliosis   . Strep throat     There are no active problems to display for this patient.   Past Surgical History:  Procedure Laterality Date  . CHOLECYSTECTOMY    . TUMOR REMOVAL    . tumor removed from neck      OB History    Gravida  0   Para  0   Term  0   Preterm  0   AB  0   Living  0     SAB  0   TAB  0   Ectopic  0   Multiple  0   Live Births               Home Medications    Prior to Admission medications   Medication Sig Start Date End Date Taking? Authorizing Provider  Alum & Mag Hydroxide-Simeth (MAGIC MOUTHWASH) SOLN Take 10 mLs by mouth 3 (three) times daily as needed for mouth pain. 10/14/14   WEarleen Newport MD  cephALEXin (KEFLEX) 250 MG capsule Take 1 capsule (250 mg total) by mouth 4 (four) times daily. 03/30/17   WDaleen Bo MD  guaiFENesin-codeine 100-10 MG/5ML syrup Take 10 mLs by mouth every 6 (six) hours as needed for cough. 12/03/16   Little, Traci M, PA-C  ondansetron (ZOFRAN) 4 MG tablet Take 1 tablet (4 mg total) by mouth every 6 (six) hours. 05/24/17   YBjorn Pippin PA-C  predniSONE (STERAPRED UNI-PAK 21 TAB) 10 MG (21) TBPK tablet Take 6 tablets on day 1. Take 5 tablets on day 2. Take 4 tablets on day 3. Take 3 tablets on day 4. Take 2 tablets on day 5. Take 1 tablets on day 6. 12/03/16   Little, Traci M, PA-C    Family History Family History  Problem Relation Age of Onset  .  Hypertension Mother     Social History Social History   Tobacco Use  . Smoking status: Former Smoker    Packs/day: 0.50    Years: 3.00    Pack years: 1.50    Types: Cigarettes  . Smokeless tobacco: Never Used  Substance Use Topics  . Alcohol use: Yes    Comment: rare  . Drug use: Yes    Types: Marijuana     Allergies   Hydrocil [psyllium] and Hydrocodone   Review of Systems Review of Systems  Constitutional: Negative for fever.  Respiratory: Negative for cough.   Gastrointestinal: Positive for nausea and vomiting. Negative for abdominal pain, constipation and diarrhea.  Genitourinary: Negative.      Physical Exam Triage Vital Signs ED Triage Vitals  Enc Vitals Group     BP 05/24/17 1657 104/63     Pulse Rate 05/24/17 1657 72     Resp --      Temp 05/24/17 1657  98.2 F (36.8 C)     Temp Source 05/24/17 1657 Oral     SpO2 05/24/17 1657 100 %     Weight --      Height --      Head Circumference --      Peak Flow --      Pain Score 05/24/17 1655 6     Pain Loc --      Pain Edu? --      Excl. in [place]? --    No data found.  Updated Vital Signs BP 104/63 (BP Location: Left Arm)   Pulse 72   Temp 98.2 F (36.8 C) (Oral)   LMP 04/27/2017   SpO2 100%   Visual Acuity Right Eye Distance:   Left Eye Distance:   Bilateral Distance:    Right Eye Near:   Left Eye Near:    Bilateral Near:     Physical Exam  Constitutional: She is oriented to person, place, and time. She appears well-developed and well-nourished. No distress.  Pulmonary/Chest: Effort normal.  Abdominal: Soft. Bowel sounds are normal. She exhibits no distension and no mass. There is no tenderness. There is no rebound and no guarding.  Neurological: She is alert and oriented to person, place, and time.  Skin: Skin is warm and dry. She is not diaphoretic.  Psychiatric: Her behavior is normal.  Nursing note and vitals reviewed.    UC Treatments / Results  Labs (all labs ordered are  listed, but only abnormal results are displayed) Labs Reviewed - No data to display  EKG None Radiology No results found.  Procedures Procedures (including critical care time)  Medications Ordered in UC Medications  ondansetron (ZOFRAN) injection 4 mg (has no administration in time range)     Initial Impression / Assessment and Plan / UC Course  I have reviewed the triage vital signs and the nursing notes.  Pertinent labs & imaging results that were available during my care of the patient were reviewed by me and considered in my medical decision making (see chart for details).     Treat symptomatically. Zofran 5m IM given here. Tablets given for home. Diet restriction discussed and remaining hydrated. FU if worsens.   Final Clinical Impressions(s) / UC Diagnoses   Final diagnoses:  Non-intractable vomiting with nausea, unspecified vomiting type    ED Discharge Orders        Ordered    ondansetron (ZOFRAN) 4 MG tablet  Every 6 hours     05/24/17 1713       Controlled Substance Prescriptions [place] Controlled Substance Registry consulted? Not Applicable   YBjorn Pippin PA-C 05/24/17 1718

## 2017-06-21 ENCOUNTER — Ambulatory Visit
Admission: RE | Admit: 2017-06-21 | Discharge: 2017-06-21 | Disposition: A | Discharge status: Home / Self Care | Payer: 59 | Source: Ambulatory Visit | Attending: Otolaryngology | Admitting: Otolaryngology

## 2017-06-21 DIAGNOSIS — Q892 Congenital malformations of other endocrine glands: Secondary | ICD-10-CM

## 2017-06-21 DIAGNOSIS — E041 Nontoxic single thyroid nodule: Secondary | ICD-10-CM | POA: Diagnosis not present

## 2017-06-30 ENCOUNTER — Encounter (HOSPITAL_COMMUNITY): Payer: Self-pay | Admitting: Emergency Medicine

## 2017-06-30 ENCOUNTER — Telehealth (HOSPITAL_COMMUNITY): Payer: Self-pay | Admitting: Emergency Medicine

## 2017-06-30 ENCOUNTER — Ambulatory Visit (HOSPITAL_COMMUNITY)
Admission: EM | Admit: 2017-06-30 | Discharge: 2017-06-30 | Disposition: A | Discharge status: Home / Self Care | Payer: 59 | Attending: Physician Assistant | Admitting: Physician Assistant

## 2017-06-30 DIAGNOSIS — R110 Nausea: Secondary | ICD-10-CM

## 2017-06-30 DIAGNOSIS — R197 Diarrhea, unspecified: Secondary | ICD-10-CM

## 2017-06-30 MED ORDER — ZINC OXIDE 6 % EX CREA: 1.0000 "application " | TOPICAL_CREAM | Freq: Three times a day (TID) | CUTANEOUS | 0 refills | Status: DC

## 2017-06-30 MED ORDER — LOPERAMIDE HCL 2 MG PO CAPS: 2.0000 mg | ORAL_CAPSULE | Freq: Four times a day (QID) | ORAL | 0 refills | Status: DC | PRN

## 2017-06-30 MED ORDER — PROMETHAZINE HCL 25 MG PO TABS: 25.0000 mg | ORAL_TABLET | Freq: Four times a day (QID) | ORAL | 0 refills | Status: DC | PRN

## 2017-06-30 NOTE — ED Triage Notes (Signed)
Pt c/o stomach cramps since Friday, thought it might be something she ate but it hasn't gone away. C/o some nausea, with some diarrhea. No vomiting. Pt states shes lost 10 pounds.

## 2017-06-30 NOTE — ED Provider Notes (Signed)
06/30/2017 5:39 PM   DOB: 09/02/1995 / MRN: [id number]  SUBJECTIVE:  Kaitlin Nelson is a 22 y.o. female presenting for diarrhea that started 4 days ago.  She tells me she is lost about 10 pounds since.  Denies feeling dizzy when she stands up.  States she has a history of Crohn's disease however has not seen a GI and no official diagnosis has been made.  He has not tried any medication since having diarrhea.  She denies bloody stools.  She is allergic to hydrocil [psyllium] and hydrocodone.   She  has a past medical history of Arthritis, Crohn disease ([place]), Esophagitis, Scoliosis, and Strep throat.    She  reports that she has quit smoking. Her smoking use included cigarettes. She has a 1.50 pack-year smoking history. She has never used smokeless tobacco. She reports that she drinks alcohol. She reports that she has current or past drug history. Drug: Marijuana. She  reports that she does not engage in sexual activity. The patient  has a past surgical history that includes Cholecystectomy; tumor removed from neck; and Tumor removal.  Her family history includes Hypertension in her mother.  Review of Systems  Constitutional: Negative for chills.  Respiratory: Negative for cough and shortness of breath.   Cardiovascular: Negative for chest pain and leg swelling.  Gastrointestinal: Positive for diarrhea and nausea. Negative for abdominal pain, blood in stool, constipation, heartburn, melena and vomiting.  Genitourinary: Negative for dysuria, flank pain, frequency, hematuria and urgency.  Skin: Negative for rash.  Neurological: Negative for dizziness.    OBJECTIVE:  BP 100/72 (BP Location: Left Arm)   Pulse 76   Temp 98.1 F (36.7 C) (Oral)   Resp 16   LMP 05/31/2017   SpO2 100%   Physical Exam  Constitutional: She is oriented to person, place, and time. She appears well-nourished. No distress.  Eyes: Pupils are equal, round, and reactive to light. EOM are normal.  Cardiovascular:  Normal rate, regular rhythm, S1 normal, S2 normal, normal heart sounds and intact distal pulses. Exam reveals no gallop, no friction rub and no decreased pulses.  No murmur heard. Pulmonary/Chest: Effort normal. No stridor. No respiratory distress. She has no wheezes. She has no rales.  Abdominal: Soft. Bowel sounds are normal. She exhibits no distension and no mass. There is no tenderness. There is no rebound and no guarding. No hernia.  Musculoskeletal: She exhibits no edema.  Neurological: She is alert and oriented to person, place, and time. No cranial nerve deficit. Gait normal.  Skin: Skin is dry. She is not diaphoretic.  Psychiatric: She has a normal mood and affect.  Vitals reviewed.   No results found for this or any previous visit (from the past 72 hour(s)).  No results found.  ASSESSMENT AND PLAN:  Orders Placed This Encounter  Procedures  . Gastrointestinal Panel by PCR , Stool    Standing Status:   Standing    Number of Occurrences:   1  . Enteric precautions (UV disinfection) C difficile, Norovirus    Standing Status:   Standing    Number of Occurrences:   1     Nausea: We will treat to control her symptoms and will try to identify the pathogen and treat only if necessary.  Would consider azithromycin 500 mg daily as long as she has no shiga toxin.   Diarrhea of presumed infectious origin      The patient is advised to call or return to clinic if she does not  see an improvement in symptoms, or to seek the care of the closest emergency department if she worsens with the above plan.   Philis Fendt, MHS, PA-C 06/30/2017 5:39 PM    Tereasa Coop, PA-C 06/30/17 1740

## 2017-06-30 NOTE — Discharge Instructions (Signed)
We will call to help you get set up with a PCP that can evaluate you for Chron and refer if necessary.

## 2017-11-11 ENCOUNTER — Emergency Department (HOSPITAL_COMMUNITY): Payer: Self-pay

## 2017-11-11 ENCOUNTER — Other Ambulatory Visit: Payer: Self-pay

## 2017-11-11 ENCOUNTER — Encounter (HOSPITAL_COMMUNITY): Payer: Self-pay | Admitting: Emergency Medicine

## 2017-11-11 ENCOUNTER — Emergency Department (HOSPITAL_COMMUNITY)
Admission: EM | Admit: 2017-11-11 | Discharge: 2017-11-11 | Disposition: A | Discharge status: Home / Self Care | Payer: Self-pay | Attending: Emergency Medicine | Admitting: Emergency Medicine

## 2017-11-11 DIAGNOSIS — J45901 Unspecified asthma with (acute) exacerbation: Secondary | ICD-10-CM | POA: Insufficient documentation

## 2017-11-11 MED ORDER — ALBUTEROL SULFATE (2.5 MG/3ML) 0.083% IN NEBU
5.0000 mg | INHALATION_SOLUTION | Freq: Once | RESPIRATORY_TRACT | Status: AC
  Administered 2017-11-11: 5 mg via RESPIRATORY_TRACT

## 2017-11-11 MED ORDER — PREDNISONE 20 MG PO TABS
60.0000 mg | ORAL_TABLET | Freq: Once | ORAL | Status: AC
  Administered 2017-11-11: 60 mg via ORAL
  Filled 2017-11-11: qty 3

## 2017-11-11 MED ORDER — ALBUTEROL SULFATE (2.5 MG/3ML) 0.083% IN NEBU
INHALATION_SOLUTION | RESPIRATORY_TRACT | Status: DC
Start: 2017-11-11 — End: 2017-11-12
  Filled 2017-11-11: qty 6

## 2017-11-11 MED ORDER — PREDNISONE 20 MG PO TABS: 40.0000 mg | ORAL_TABLET | Freq: Every day | ORAL | 0 refills | Status: DC

## 2017-11-11 MED ORDER — ALBUTEROL SULFATE (2.5 MG/3ML) 0.083% IN NEBU
2.5000 mg | INHALATION_SOLUTION | Freq: Once | RESPIRATORY_TRACT | Status: AC
  Administered 2017-11-11: 2.5 mg via RESPIRATORY_TRACT
  Filled 2017-11-11: qty 3

## 2017-11-11 NOTE — ED Provider Notes (Signed)
MSE was initiated and I personally evaluated the patient and placed orders (if any) at  6:48 PM on November 11, 2017.  The patient appears stable so that the remainder of the MSE may be completed by another provider.  Patient placed in Quick Look pathway, seen and evaluated   Chief Complaint: Wheezing, shortness of breath  HPI:   Patient is a 22 year old female with a history of asthma presenting for wheezing and shortness of breath.  Patient reports that she had an upper respiratory infection over the past several days, and her symptoms of rhinorrhea and congestion resolved today, but she began having diffuse wheezing and sensation that she "could not get air".  Patient reports that she has been borrowing her friend's inhaler, she does not have a primary care provider.  No fever chills. No estrogen use, recent immobilization, hospitalization, history of DVT/PE, cancer treatment.  Patient does report that she has noticed some blood-tinged mucus, again that it came from her nose.  ROS: See HPI (one)  Physical Exam:   Gen: Respiratory discomfort with labored breathing  Neuro: Awake and Alert  Skin: Warm    Focused Exam: Patient exhibits diffuse inspiratory and expiratory wheezes in all fields, worse in upper fields.  Heart regular rate and rhythm.    Patient placed on 5 mg albuterol treatment.  Do feel that patient will need further therapy, and discussed with RN given patient's significant increased work of breating.   6:52 PM Reassessed.  Patient appears to be feeling better, reports she is breathing better, and is speaking full sentences.  Still has diffuse wheezes, but inspiratory wheezes are decreased.  Initiation of care has begun. The patient has been counseled on the process, plan, and necessity for staying for the completion/evaluation, and the remainder of the medical screening examination    Tamala Julian 11/11/17 Yevette Edwards    Virgel Manifold, MD 11/12/17 1531

## 2017-11-11 NOTE — ED Triage Notes (Signed)
Pt to ER for asthma, patient in respiratory distress on arrival, vitals are stable, patient speaking in broken sentences, inspiratory and expiratory wheezing noted on auscultation.

## 2018-04-30 DIAGNOSIS — Q892 Congenital malformations of other endocrine glands: Secondary | ICD-10-CM | POA: Diagnosis not present

## 2018-05-02 ENCOUNTER — Encounter (HOSPITAL_BASED_OUTPATIENT_CLINIC_OR_DEPARTMENT_OTHER): Payer: Self-pay | Admitting: *Deleted

## 2018-05-02 ENCOUNTER — Other Ambulatory Visit: Payer: Self-pay

## 2018-05-07 ENCOUNTER — Ambulatory Visit: Payer: Self-pay | Admitting: Otolaryngology

## 2018-05-07 NOTE — H&P (Signed)
PREOPERATIVE H&P  Chief Complaint: thyroglossal duct cyst  HPI: Kaitlin Nelson is a 23 y.o. female who presents for evaluation of anterior enlarging neck mass. She had previous evaluation of this with ultrasound last year that demonstrated probable thyroglossal duct cyst. This spontaneously got smaller and nothing was done. However over the past several months it has enlarged again. She is taken to the operating room at this time for removal of thyroglossal duct cyst.   Past Medical History:  Diagnosis Date  . Asthma   . Bipolar disorder ([place])   . Crohn disease ([place])   . Scoliosis    Past Surgical History:  Procedure Laterality Date  . CHOLECYSTECTOMY    . TUMOR REMOVAL    . tumor removed from neck     Social History   Socioeconomic History  . Marital status: Single    Spouse name: Not on file  . Number of children: Not on file  . Years of education: Not on file  . Highest education level: Not on file  Occupational History  . Not on file  Social Needs  . Financial resource strain: Not on file  . Food insecurity:    Worry: Not on file    Inability: Not on file  . Transportation needs:    Medical: Not on file    Non-medical: Not on file  Tobacco Use  . Smoking status: Former Smoker    Packs/day: 0.50    Years: 3.00    Pack years: 1.50    Types: Cigarettes  . Smokeless tobacco: Never Used  Substance and Sexual Activity  . Alcohol use: Yes    Comment: rare  . Drug use: Yes    Types: Marijuana    Comment: last used 05/01/18  . Sexual activity: Yes    Comment: Female Partner  Lifestyle  . Physical activity:    Days per week: Not on file    Minutes per session: Not on file  . Stress: Not on file  Relationships  . Social connections:    Talks on phone: Not on file    Gets together: Not on file    Attends religious service: Not on file    Active member of club or organization: Not on file    Attends meetings of clubs or organizations: Not on file   Relationship status: Not on file  Other Topics Concern  . Not on file  Social History Narrative   Lives with Bary Leriche.   Family History  Problem Relation Age of Onset  . Hypertension Mother    No Known Allergies Prior to Admission medications   Medication Sig Start Date End Date Taking? Authorizing Provider  albuterol (VENTOLIN HFA) 108 (90 Base) MCG/ACT inhaler Inhale into the lungs every 6 (six) hours as needed for wheezing or shortness of breath.    Yes Joline Salt, RN  lamoTRIgine (LAMICTAL) 25 MG tablet Take 25 mg by mouth daily.   Yes Joline Salt, RN  prazosin (MINIPRESS) 2 MG capsule Take 2 mg by mouth at bedtime.   Yes Joline Salt, RN  ziprasidone (GEODON) 60 MG capsule Take 60 mg by mouth 2 (two) times daily with a meal.   Yes Joline Salt, RN  promethazine (PHENERGAN) 25 MG tablet Take 1 tablet (25 mg total) by mouth every 6 (six) hours as needed for nausea or vomiting. 06/30/17   Tereasa Coop, PA-C     Positive ROS: Negative  All other systems have been reviewed  and were otherwise negative with the exception of those mentioned in the HPI and as above.  Physical Exam: There were no vitals filed for this visit.  General: Alert, no acute distress Oral: Normal oral mucosa and tonsils Nasal: Clear nasal passages Neck: No palpable adenopathy or thyroid nodules. She has an anterior neck mass that is just left of midline just below the level of the hyhoid bone. Measures approximately to 2 cm in size. Ear: Ear canal is clear with normal appearing TMs Cardiovascular: Regular rate and rhythm, no murmur.  Respiratory: Clear to auscultation Neurologic: Alert and oriented x 3   Assessment/Plan: THYROID DUCT CYST Plan for Procedure(s): REMOVAL OF THYROID DUCT CYST   Melony Overly, MD 05/07/2018 1:56 PM

## 2018-05-08 ENCOUNTER — Other Ambulatory Visit: Payer: Self-pay

## 2018-05-08 ENCOUNTER — Ambulatory Visit (INDEPENDENT_AMBULATORY_CARE_PROVIDER_SITE_OTHER): Payer: 59 | Admitting: Gynecology

## 2018-05-08 ENCOUNTER — Encounter: Payer: Self-pay | Admitting: Gynecology

## 2018-05-08 VITALS — BP 114/70 | Ht 64.0 in | Wt 232.0 lb

## 2018-05-08 DIAGNOSIS — N9089 Other specified noninflammatory disorders of vulva and perineum: Secondary | ICD-10-CM

## 2018-05-08 DIAGNOSIS — N946 Dysmenorrhea, unspecified: Secondary | ICD-10-CM

## 2018-05-08 DIAGNOSIS — A630 Anogenital (venereal) warts: Secondary | ICD-10-CM

## 2018-05-08 DIAGNOSIS — Z01419 Encounter for gynecological examination (general) (routine) without abnormal findings: Secondary | ICD-10-CM | POA: Diagnosis not present

## 2018-05-08 DIAGNOSIS — Z113 Encounter for screening for infections with a predominantly sexual mode of transmission: Secondary | ICD-10-CM

## 2018-05-08 MED ORDER — IBUPROFEN 800 MG PO TABS: 800.0000 mg | ORAL_TABLET | Freq: Three times a day (TID) | ORAL | 3 refills | Status: DC | PRN

## 2018-05-08 NOTE — Patient Instructions (Signed)
Office will call you with the biopsy results.  Follow-up for the Gardasil series as you choose.  Start on the ibuprofen as we talked about.  Follow-up if your periods continue to be bad.  Follow-up in 1 year for annual exam

## 2018-05-08 NOTE — Addendum Note (Signed)
Addended by: Nelva Nay on: 05/08/2018 01:06 PM   Modules accepted: Orders

## 2018-05-08 NOTE — Progress Notes (Signed)
    Kaitlin Nelson [birth year] [id number]        22 y.o.  G0P0000 new patient for first gynecologic exam.  Notes several skin tags that have developed over the past year.  Also complaining of worsening menses with increasing dysmenorrhea.  Remain monthly.  History of similar after menarche and was transiently placed on oral contraceptives which helped.  Currently same-sex sexually active  Past medical history,surgical history, problem list, medications, allergies, family history and social history were all reviewed and documented as reviewed in the EPIC chart.  ROS:  Performed with pertinent positives and negatives included in the history, assessment and plan.   Additional significant findings : None   Exam: Caryn Bee assistant Vitals:   05/08/18 1158  BP: 114/70  Weight: 232 lb (105.2 kg)  Height: 5' 4"  (1.626 m)   Body mass index is 39.82 kg/m.  General appearance:  Normal affect, orientation and appearance. Skin: Grossly normal HEENT: Without gross lesions.  No cervical or supraclavicular adenopathy. Thyroid normal.  Lungs:  Clear without wheezing, rales or rhonchi Cardiac: RR, without RMG Abdominal:  Soft, nontender, without masses, guarding, rebound, organomegaly or hernia Breasts:  Examined lying and sitting without masses, retractions, discharge or axillary adenopathy. Pelvic:  Ext, BUS, Vagina: With 3 broad-based skin tags as diagrammed  Cervix: Normal.  GC/chlamydia screen.  Pap smear.  Uterus: Anteverted, normal size, shape and contour, midline and mobile nontender   Adnexa: Without masses or tenderness    Anus and perineum: Normal   Physical Exam  Genitourinary:      Procedure: The skin overlying and surrounding the 3 skin tags was cleansed with Betadine and subsequently infiltrated with 1% lidocaine.  All 3 skin tags were excised in their entirety.  Silver nitrate hemostasis applied afterwards.  Patient tolerated well.    Assessment/Plan:  23 y.o. G0P0000  female for annual gynecologic exam.  With regular menses, same-sex relationship  1. Skin tags x3.  Discussed options for management.  They are all bothersome to the patient and that they cause discomfort when moved.  Excised as above note and sent to pathology. 2. STD screening discussed.  GC/chlamydia screen done of the cervix. 3. Contraception not an issue at this point. 4. Breast health.  Breast exam normal today.  SBE monthly reviewed. 5. Gardasil series never.  Recommended patient consider and start the series. 6. Health maintenance.  No routine screening lab work done as patient without signs or symptoms to warrant screening at this time.   Anastasio Auerbach MD, 12:38 PM 05/08/2018

## 2018-05-09 LAB — PAP IG W/ RFLX HPV ASCU

## 2018-05-09 LAB — C. TRACHOMATIS/N. GONORRHOEAE RNA
C. TRACHOMATIS RNA, TMA: NOT DETECTED
N. GONORRHOEAE RNA, TMA: NOT DETECTED

## 2018-05-12 ENCOUNTER — Ambulatory Visit (HOSPITAL_BASED_OUTPATIENT_CLINIC_OR_DEPARTMENT_OTHER)
Admission: RE | Admit: 2018-05-12 | Discharge: 2018-05-12 | Disposition: A | Discharge status: Home / Self Care | Payer: 59 | Attending: Otolaryngology | Admitting: Otolaryngology

## 2018-05-12 ENCOUNTER — Ambulatory Visit (HOSPITAL_BASED_OUTPATIENT_CLINIC_OR_DEPARTMENT_OTHER): Payer: 59 | Admitting: Anesthesiology

## 2018-05-12 ENCOUNTER — Encounter (HOSPITAL_BASED_OUTPATIENT_CLINIC_OR_DEPARTMENT_OTHER)
Admission: RE | Disposition: A | Discharge status: Home / Self Care | Payer: Self-pay | Source: Home / Self Care | Attending: Otolaryngology

## 2018-05-12 ENCOUNTER — Encounter (HOSPITAL_BASED_OUTPATIENT_CLINIC_OR_DEPARTMENT_OTHER): Payer: Self-pay | Admitting: Anesthesiology

## 2018-05-12 ENCOUNTER — Other Ambulatory Visit: Payer: Self-pay

## 2018-05-12 DIAGNOSIS — Z6839 Body mass index (BMI) 39.0-39.9, adult: Secondary | ICD-10-CM | POA: Insufficient documentation

## 2018-05-12 DIAGNOSIS — F319 Bipolar disorder, unspecified: Secondary | ICD-10-CM | POA: Diagnosis not present

## 2018-05-12 DIAGNOSIS — Z79899 Other long term (current) drug therapy: Secondary | ICD-10-CM | POA: Insufficient documentation

## 2018-05-12 DIAGNOSIS — Z87891 Personal history of nicotine dependence: Secondary | ICD-10-CM | POA: Diagnosis not present

## 2018-05-12 DIAGNOSIS — J45909 Unspecified asthma, uncomplicated: Secondary | ICD-10-CM | POA: Insufficient documentation

## 2018-05-12 DIAGNOSIS — Q892 Congenital malformations of other endocrine glands: Secondary | ICD-10-CM | POA: Insufficient documentation

## 2018-05-12 HISTORY — DX: Bipolar disorder, unspecified: F31.9

## 2018-05-12 HISTORY — PX: THYROGLOSSAL DUCT CYST: SHX297

## 2018-05-12 HISTORY — DX: Unspecified asthma, uncomplicated: J45.909

## 2018-05-12 LAB — TISSUE SPECIMEN

## 2018-05-12 LAB — PATHOLOGY

## 2018-05-12 LAB — POCT PREGNANCY, URINE: Preg Test, Ur: NEGATIVE

## 2018-05-12 SURGERY — EXCISION, THYROGLOSSAL DUCT CYST
Anesthesia: General | Site: Neck

## 2018-05-12 MED ORDER — KETOROLAC TROMETHAMINE 30 MG/ML IJ SOLN
INTRAMUSCULAR | Status: AC
  Filled 2018-05-12: qty 1

## 2018-05-12 MED ORDER — ESMOLOL HCL 100 MG/10ML IV SOLN
INTRAVENOUS | Status: DC | PRN
  Administered 2018-05-12: 10 mg via INTRAVENOUS

## 2018-05-12 MED ORDER — SUGAMMADEX SODIUM 200 MG/2ML IV SOLN
INTRAVENOUS | Status: DC | PRN
  Administered 2018-05-12: 250 mg via INTRAVENOUS

## 2018-05-12 MED ORDER — SUFENTANIL CITRATE 50 MCG/ML IV SOLN
INTRAVENOUS | Status: DC | PRN
  Administered 2018-05-12: 5 ug via INTRAVENOUS
  Administered 2018-05-12: 10 ug via INTRAVENOUS

## 2018-05-12 MED ORDER — ATROPINE SULFATE 0.4 MG/ML IJ SOLN
INTRAMUSCULAR | Status: AC
  Filled 2018-05-12: qty 1

## 2018-05-12 MED ORDER — KETOROLAC TROMETHAMINE 30 MG/ML IJ SOLN
30.0000 mg | Freq: Once | INTRAMUSCULAR | Status: AC | PRN
  Administered 2018-05-12: 30 mg via INTRAVENOUS

## 2018-05-12 MED ORDER — EPHEDRINE SULFATE 50 MG/ML IJ SOLN
INTRAMUSCULAR | Status: DC | PRN
  Administered 2018-05-12 (×2): 10 mg via INTRAVENOUS

## 2018-05-12 MED ORDER — DEXAMETHASONE SODIUM PHOSPHATE 4 MG/ML IJ SOLN
INTRAMUSCULAR | Status: DC | PRN
  Administered 2018-05-12: 10 mg via INTRAVENOUS

## 2018-05-12 MED ORDER — HYDROCODONE-ACETAMINOPHEN 5-325 MG PO TABS: 1.0000 | ORAL_TABLET | Freq: Four times a day (QID) | ORAL | 0 refills | Status: DC | PRN

## 2018-05-12 MED ORDER — OXYCODONE HCL 5 MG/5ML PO SOLN: 5.0000 mg | Freq: Once | ORAL | Status: DC | PRN

## 2018-05-12 MED ORDER — PROPOFOL 10 MG/ML IV BOLUS
INTRAVENOUS | Status: DC | PRN
  Administered 2018-05-12: 200 mg via INTRAVENOUS

## 2018-05-12 MED ORDER — PROMETHAZINE HCL 25 MG/ML IJ SOLN: 6.2500 mg | INTRAMUSCULAR | Status: DC | PRN

## 2018-05-12 MED ORDER — LIDOCAINE-EPINEPHRINE 1 %-1:100000 IJ SOLN
INTRAMUSCULAR | Status: DC | PRN
  Administered 2018-05-12: 5 mL

## 2018-05-12 MED ORDER — SUFENTANIL CITRATE 50 MCG/ML IV SOLN
INTRAVENOUS | Status: AC
  Filled 2018-05-12: qty 1

## 2018-05-12 MED ORDER — OXYCODONE HCL 5 MG PO TABS: 5.0000 mg | ORAL_TABLET | Freq: Once | ORAL | Status: DC | PRN

## 2018-05-12 MED ORDER — ONDANSETRON HCL 4 MG/2ML IJ SOLN
INTRAMUSCULAR | Status: DC | PRN
  Administered 2018-05-12 (×2): 4 mg via INTRAVENOUS

## 2018-05-12 MED ORDER — CEFAZOLIN SODIUM-DEXTROSE 2-4 GM/100ML-% IV SOLN
2.0000 g | INTRAVENOUS | Status: AC
  Administered 2018-05-12 (×2): 2 g via INTRAVENOUS

## 2018-05-12 MED ORDER — ROCURONIUM BROMIDE 100 MG/10ML IV SOLN
INTRAVENOUS | Status: DC | PRN
  Administered 2018-05-12: 40 mg via INTRAVENOUS

## 2018-05-12 MED ORDER — ACETAMINOPHEN 500 MG PO TABS
ORAL_TABLET | ORAL | Status: AC
  Filled 2018-05-12: qty 2

## 2018-05-12 MED ORDER — HYDROMORPHONE HCL 1 MG/ML IJ SOLN
INTRAMUSCULAR | Status: AC
  Filled 2018-05-12: qty 0.5

## 2018-05-12 MED ORDER — CEFAZOLIN SODIUM-DEXTROSE 2-4 GM/100ML-% IV SOLN
INTRAVENOUS | Status: AC
  Filled 2018-05-12: qty 100

## 2018-05-12 MED ORDER — CHLORHEXIDINE GLUCONATE CLOTH 2 % EX PADS: 6.0000 | MEDICATED_PAD | Freq: Once | CUTANEOUS | Status: DC

## 2018-05-12 MED ORDER — PHENYLEPHRINE HCL 10 MG/ML IJ SOLN
INTRAMUSCULAR | Status: DC | PRN
  Administered 2018-05-12: 80 ug via INTRAVENOUS

## 2018-05-12 MED ORDER — LIDOCAINE HCL (CARDIAC) PF 100 MG/5ML IV SOSY
PREFILLED_SYRINGE | INTRAVENOUS | Status: DC | PRN
  Administered 2018-05-12: 50 mg via INTRAVENOUS

## 2018-05-12 MED ORDER — LACTATED RINGERS IV SOLN
INTRAVENOUS | Status: DC
  Administered 2018-05-12 (×5): via INTRAVENOUS

## 2018-05-12 MED ORDER — ACETAMINOPHEN 500 MG PO TABS
1000.0000 mg | ORAL_TABLET | Freq: Once | ORAL | Status: AC
  Administered 2018-05-12: 1000 mg via ORAL

## 2018-05-12 MED ORDER — HYDROMORPHONE HCL 1 MG/ML IJ SOLN
0.2500 mg | INTRAMUSCULAR | Status: DC | PRN
  Administered 2018-05-12: 0.5 mg via INTRAVENOUS

## 2018-05-12 MED ORDER — GLYCOPYRROLATE 0.2 MG/ML IJ SOLN
INTRAMUSCULAR | Status: DC | PRN
  Administered 2018-05-12: 0.2 mg via INTRAVENOUS

## 2018-05-12 MED ORDER — SODIUM CHLORIDE 0.9 % IV SOLN
INTRAVENOUS | Status: DC | PRN
  Administered 2018-05-12: 40 ug/min via INTRAVENOUS

## 2018-05-12 MED ORDER — LIDOCAINE-EPINEPHRINE 1 %-1:100000 IJ SOLN
INTRAMUSCULAR | Status: AC
  Filled 2018-05-12: qty 1

## 2018-05-12 MED ORDER — SCOPOLAMINE 1 MG/3DAYS TD PT72: 1.0000 | MEDICATED_PATCH | Freq: Once | TRANSDERMAL | Status: DC | PRN

## 2018-05-12 MED ORDER — MIDAZOLAM HCL 2 MG/2ML IJ SOLN
1.0000 mg | INTRAMUSCULAR | Status: DC | PRN
  Administered 2018-05-12: 2 mg via INTRAVENOUS

## 2018-05-12 MED ORDER — CEPHALEXIN 500 MG PO CAPS: 500.0000 mg | ORAL_CAPSULE | Freq: Two times a day (BID) | ORAL | 0 refills | Status: DC

## 2018-05-12 MED ORDER — FENTANYL CITRATE (PF) 100 MCG/2ML IJ SOLN: 50.0000 ug | INTRAMUSCULAR | Status: DC | PRN

## 2018-05-12 MED ORDER — MIDAZOLAM HCL 2 MG/2ML IJ SOLN
INTRAMUSCULAR | Status: AC
  Filled 2018-05-12: qty 2

## 2018-05-12 SURGICAL SUPPLY — 55 items
BENZOIN TINCTURE PRP APPL 2/3 (GAUZE/BANDAGES/DRESSINGS) IMPLANT
BLADE SURG 15 STRL LF DISP TIS (BLADE) ×1 IMPLANT
BLADE SURG 15 STRL SS (BLADE) ×1
CANISTER SUCT 1200ML W/VALVE (MISCELLANEOUS) ×2 IMPLANT
CLEANER CAUTERY TIP 5X5 PAD (MISCELLANEOUS) IMPLANT
CORD BIPOLAR FORCEPS 12FT (ELECTRODE) ×2 IMPLANT
COVER BACK TABLE 60X90IN (DRAPES) ×2 IMPLANT
COVER MAYO STAND STRL (DRAPES) ×2 IMPLANT
COVER WAND RF STERILE (DRAPES) IMPLANT
DECANTER SPIKE VIAL GLASS SM (MISCELLANEOUS) IMPLANT
DERMABOND ADVANCED (GAUZE/BANDAGES/DRESSINGS) ×1
DERMABOND ADVANCED .7 DNX12 (GAUZE/BANDAGES/DRESSINGS) ×1 IMPLANT
DRAPE U-SHAPE 76X120 STRL (DRAPES) ×2 IMPLANT
ELECT COATED BLADE 2.86 ST (ELECTRODE) ×2 IMPLANT
ELECT REM PT RETURN 9FT ADLT (ELECTROSURGICAL) ×2
ELECTRODE REM PT RTRN 9FT ADLT (ELECTROSURGICAL) ×1 IMPLANT
GAUZE 4X4 16PLY RFD (DISPOSABLE) IMPLANT
GAUZE SPONGE 4X4 12PLY STRL LF (GAUZE/BANDAGES/DRESSINGS) IMPLANT
GLOVE BIO SURGEON STRL SZ7 (GLOVE) ×4 IMPLANT
GLOVE SS BIOGEL STRL SZ 7.5 (GLOVE) ×1 IMPLANT
GLOVE SUPERSENSE BIOGEL SZ 7.5 (GLOVE) ×1
GOWN STRL REUS W/ TWL LRG LVL3 (GOWN DISPOSABLE) ×1 IMPLANT
GOWN STRL REUS W/ TWL XL LVL3 (GOWN DISPOSABLE) ×1 IMPLANT
GOWN STRL REUS W/TWL LRG LVL3 (GOWN DISPOSABLE) ×1
GOWN STRL REUS W/TWL XL LVL3 (GOWN DISPOSABLE) ×1
NEEDLE HYPO 25X1 1.5 SAFETY (NEEDLE) IMPLANT
NS IRRIG 1000ML POUR BTL (IV SOLUTION) ×2 IMPLANT
PACK BASIN DAY SURGERY FS (CUSTOM PROCEDURE TRAY) ×2 IMPLANT
PAD CLEANER CAUTERY TIP 5X5 (MISCELLANEOUS)
PENCIL BUTTON HOLSTER BLD 10FT (ELECTRODE) ×2 IMPLANT
SLEEVE SCD COMPRESS KNEE MED (MISCELLANEOUS) ×2 IMPLANT
SPONGE INTESTINAL PEANUT (DISPOSABLE) IMPLANT
STRIP CLOSURE SKIN 1/2X4 (GAUZE/BANDAGES/DRESSINGS) IMPLANT
STRIP CLOSURE SKIN 1/4X4 (GAUZE/BANDAGES/DRESSINGS) IMPLANT
SUCTION FRAZIER HANDLE 10FR (MISCELLANEOUS)
SUCTION TUBE FRAZIER 10FR DISP (MISCELLANEOUS) IMPLANT
SUT CHROMIC 3 0 PS 2 (SUTURE) IMPLANT
SUT CHROMIC 3 0 SH 27 (SUTURE) ×2 IMPLANT
SUT ETHILON 4 0 PS 2 18 (SUTURE) IMPLANT
SUT ETHILON 5 0 P 3 18 (SUTURE)
SUT NYLON ETHILON 5-0 P-3 1X18 (SUTURE) IMPLANT
SUT SILK 2 0 TIES 17X18 (SUTURE)
SUT SILK 2-0 18XBRD TIE BLK (SUTURE) IMPLANT
SUT SILK 3 0 SH 30 (SUTURE) IMPLANT
SUT SILK 3 0 TIES 17X18 (SUTURE) ×1
SUT SILK 3-0 18XBRD TIE BLK (SUTURE) ×1 IMPLANT
SUT VIC AB 5-0 P-3 18X BRD (SUTURE) ×1 IMPLANT
SUT VIC AB 5-0 P3 18 (SUTURE) ×1
SWAB COLLECTION DEVICE MRSA (MISCELLANEOUS) IMPLANT
SWAB CULTURE ESWAB REG 1ML (MISCELLANEOUS) IMPLANT
SYR BULB 3OZ (MISCELLANEOUS) ×2 IMPLANT
SYR CONTROL 10ML LL (SYRINGE) ×2 IMPLANT
TOWEL GREEN STERILE FF (TOWEL DISPOSABLE) ×2 IMPLANT
TRAY DSU PREP LF (CUSTOM PROCEDURE TRAY) ×2 IMPLANT
TUBE CONNECTING 20X1/4 (TUBING) ×2 IMPLANT

## 2018-05-12 NOTE — Brief Op Note (Signed)
05/12/2018  9:44 AM  PATIENT:  Kaitlin Nelson  23 y.o. female  PRE-OPERATIVE DIAGNOSIS:  THYROID DUCT CYST  POST-OPERATIVE DIAGNOSIS:  THYROID DUCT CYST  PROCEDURE:  Procedure(s): REMOVAL OF THYROID DUCT CYST (N/A)  SURGEON:  Surgeon(s) and Role:    Rozetta Nunnery, MD - Primary  PHYSICIAN ASSISTANT:   ASSISTANTS: none   ANESTHESIA:   general  EBL:  25 mL   BLOOD ADMINISTERED:none  DRAINS: none   LOCAL MEDICATIONS USED:  XYLOCAINE   SPECIMEN:  Source of Specimen:  Neck cyst  DISPOSITION OF SPECIMEN:  PATHOLOGY  COUNTS:  YES  TOURNIQUET:  * No tourniquets in log *  DICTATION: .Other Dictation: Dictation Number [id number]  PLAN OF CARE: Discharge to home after PACU  PATIENT DISPOSITION:  PACU - hemodynamically stable.   Delay start of Pharmacological VTE agent (>24hrs) due to surgical blood loss or risk of bleeding: yes

## 2018-05-12 NOTE — Anesthesia Preprocedure Evaluation (Addendum)
Anesthesia Evaluation  Patient identified by MRN, date of birth, ID band Patient awake    Reviewed: Allergy & Precautions, NPO status , Patient's Chart, lab work & pertinent test results  Airway Mallampati: I  TM Distance: >3 FB Neck ROM: Full    Dental no notable dental hx.    Pulmonary asthma , former smoker,    Pulmonary exam normal breath sounds clear to auscultation       Cardiovascular negative cardio ROS Normal cardiovascular exam Rhythm:Regular Rate:Normal  ECG: NSR, rate 90   Neuro/Psych PSYCHIATRIC DISORDERS Bipolar Disorder negative neurological ROS     GI/Hepatic Neg liver ROS, Crohn disease    Endo/Other  Morbid obesity  Renal/GU negative Renal ROS     Musculoskeletal Scoliosis   Abdominal (+) + obese,   Peds  Hematology negative hematology ROS (+)   Anesthesia Other Findings THYROID DUCT CYST  Reproductive/Obstetrics hcg negative                             Anesthesia Physical Anesthesia Plan  ASA: III  Anesthesia Plan: General   Post-op Pain Management:    Induction: Intravenous  PONV Risk Score and Plan: 3 and Midazolam, Dexamethasone, Ondansetron and Treatment may vary due to age or medical condition  Airway Management Planned: Oral ETT  Additional Equipment:   Intra-op Plan:   Post-operative Plan: Extubation in OR  Informed Consent: I have reviewed the patients History and Physical, chart, labs and discussed the procedure including the risks, benefits and alternatives for the proposed anesthesia with the patient or authorized representative who has indicated his/her understanding and acceptance.     Dental advisory given  Plan Discussed with: CRNA  Anesthesia Plan Comments:         Anesthesia Quick Evaluation

## 2018-05-12 NOTE — Anesthesia Procedure Notes (Signed)

## 2018-05-12 NOTE — Interval H&P Note (Signed)
History and Physical Interval Note:  05/12/2018 7:31 AM  Kaitlin Nelson  has presented today for surgery, with the diagnosis of THYROID DUCT CYST.  The various methods of treatment have been discussed with the patient and family. After consideration of risks, benefits and other options for treatment, the patient has consented to  Procedure(s): REMOVAL OF THYROID DUCT CYST (N/A) as a surgical intervention.  The patient's history has been reviewed, patient examined, no change in status, stable for surgery.  I have reviewed the patient's chart and labs.  Questions were answered to the patient's satisfaction.     Melony Overly

## 2018-05-12 NOTE — Op Note (Signed)
NAME: Kaitlin Nelson, Kaitlin Nelson MEDICAL RECORD QA:[id number] ACCOUNT [id number] DATE OF BIRTH:1995/04/01 FACILITY: [facility] LOCATION: [facility] PHYSICIAN:Monterius Rolf Lincoln Maxin, MD  OPERATIVE REPORT  DATE OF PROCEDURE:  05/12/2018  PREOPERATIVE DIAGNOSIS:  Midline neck cyst consistent with thyroglossal duct cyst.  POSTOPERATIVE DIAGNOSIS:  Midline neck cyst consistent with thyroglossal duct cyst.  OPERATION PERFORMED:  Excision of thyroglossal duct cyst.  SURGEON:  Melony Overly, MD  ANESTHESIA:  General endotracheal.  ESTIMATED BLOOD LOSS:  Minimal.  COMPLICATIONS:  None.  BRIEF CLINICAL NOTE:  [name] is a 23 year old female who has had a previous history of ultrasound of a midline neck cyst that was consistent with probable thyroglossal duct.  Apparently, this got much smaller after ultrasound and nothing was done to it.   She now has recurrence of the cyst.    On exam, she has an approximate 2 cm neck cyst just left of midline at approximate level of the hyoid bone.  It is a little firm to palpation.  She is taken to the operating room at this time for excision of thyroglossal duct cyst.  DESCRIPTION OF PROCEDURE:  The area of the cyst was marked out and injected with 5 mL of Xylocaine with epinephrine for hemostasis and local anesthetic.  Neck was then extended.  A horizontal incision was made just at the inferior edge of the cyst.   Dissection was carried down through subcutaneous tissue and strap muscles.  The cyst was located and was bluntly dissected out.  The cyst was lying directly over the hyoid bone.  The cyst was removed.  Superiorly as it went to the hyoid bone and up to  the base of tongue, questionable tract was identified and this was ligated with 3-0 silk suture.  The cyst was dissected off of the hyoid bone with cautery.  There was no obvious extension into the hyoid bone and the hyoid bone was cauterized centrally,  superiorly and inferiorly.  The central portion of the  hyoid bone was not resected.  The wound was irrigated with saline.  Hemostasis was obtained with bipolar cautery.  Wound was then closed with 3-0 chromic suture subcutaneously and 5-0 Vicryl  subcuticular stitch followed by Dermabond.    The patient was awoken from anesthesia and transferred to recovery room  postoperatively doing well.  DISPOSITION:  The patient is discharged home on Keflex 500 mg b.i.d. for 5 days.  Tylenol, ibuprofen and hydrocodone p.r.n. pain.  She will follow up in my office in 1 week for recheck.  The cyst was sent to Pathology.  AN/NUANCE  D:05/12/2018 T:05/12/2018 JOB:[id number]

## 2018-05-12 NOTE — Anesthesia Postprocedure Evaluation (Signed)
Anesthesia Post Note  Patient: Kaitlin Nelson  Procedure(s) Performed: REMOVAL OF THYROID DUCT CYST (N/A Neck)     Patient location during evaluation: PACU Anesthesia Type: General Level of consciousness: awake and alert Pain management: pain level controlled Vital Signs Assessment: post-procedure vital signs reviewed and stable Respiratory status: spontaneous breathing, nonlabored ventilation, respiratory function stable and patient connected to nasal cannula oxygen Cardiovascular status: blood pressure returned to baseline and stable Postop Assessment: no apparent nausea or vomiting Anesthetic complications: no    Last Vitals:  Vitals:   05/12/18 1156 05/12/18 1208  BP:  101/67  Pulse: 67 62  Resp:  18  Temp:    SpO2: 99% 99%    Last Pain:  Vitals:   05/12/18 1208  TempSrc:   PainSc: 4                  Ryan P Ellender

## 2018-05-12 NOTE — Transfer of Care (Signed)
Immediate Anesthesia Transfer of Care Note  Patient: Kaitlin Nelson  Procedure(s) Performed: REMOVAL OF THYROID DUCT CYST (N/A Neck)  Patient Location: PACU  Anesthesia Type:General  Level of Consciousness: awake, alert  and confused  Airway & Oxygen Therapy: Patient Spontanous Breathing and Patient connected to nasal cannula oxygen  Post-op Assessment: Report given to RN and Post -op Vital signs reviewed and stable  Post vital signs: Reviewed and stable  Last Vitals:  Vitals Value Taken Time  BP 88/43 05/12/2018  9:37 AM  Temp 37.4 C 05/12/2018  9:32 AM  Pulse 84 05/12/2018  9:41 AM  Resp 29 05/12/2018  9:41 AM  SpO2 98 % 05/12/2018  9:41 AM  Vitals shown include unvalidated device data.  Last Pain:  Vitals:   05/12/18 0643  TempSrc: Oral  PainSc: 0-No pain         Complications: No apparent anesthesia complications

## 2018-05-12 NOTE — Discharge Instructions (Addendum)
°  Post Anesthesia Home Care Instructions  Activity: Get plenty of rest for the remainder of the day. A responsible individual must stay with you for 24 hours following the procedure.  For the next 24 hours, DO NOT: -Drive a car -Paediatric nurse -Drink alcoholic beverages -Take any medication unless instructed by your physician -Make any legal decisions or sign important papers.  Meals: Start with liquid foods such as gelatin or soup. Progress to regular foods as tolerated. Avoid greasy, spicy, heavy foods. If nausea and/or vomiting occur, drink only clear liquids until the nausea and/or vomiting subsides. Call your physician if vomiting continues.  Special Instructions/Symptoms: Your throat may feel dry or sore from the anesthesia or the breathing tube placed in your throat during surgery. If this causes discomfort, gargle with warm salt water. The discomfort should disappear within 24 hours.  If you had a scopolamine patch placed behind your ear for the management of post- operative nausea and/or vomiting:  1. The medication in the patch is effective for 72 hours, after which it should be removed.  Wrap patch in a tissue and discard in the trash. Wash hands thoroughly with soap and water. 2. You may remove the patch earlier than 72 hours if you experience unpleasant side effects which may include dry mouth, dizziness or visual disturbances. 3. Avoid touching the patch. Wash your hands with soap and water after contact with the patch.  Keep incision site dry for the next 24 hrs. Tylenol , ibuprofen or Hydrocodone 5 mg every 6 hrs prn pain Keflex 500 mg tice per day . Take 2 today. Call office for follow up appt in  1 week    [phone]

## 2018-05-13 ENCOUNTER — Encounter (HOSPITAL_BASED_OUTPATIENT_CLINIC_OR_DEPARTMENT_OTHER): Payer: Self-pay | Admitting: Otolaryngology

## 2018-05-13 ENCOUNTER — Telehealth: Payer: Self-pay | Admitting: *Deleted

## 2018-05-13 NOTE — Telephone Encounter (Signed)
Pt had questions of HPV since her new Dx of genital warts.  She asked about transmitting it to a baby through delivery. Very low transmission especially if not symptoms at time of delivery. Transmission to same sex partner. Advised to clean any toys properly. Oral transmission of genital warts is extremely low but could receive another form of HPV orally.  Pt wants other bumps checked. Apt made for [facility] 05/16/18 KW CMA

## 2018-05-13 NOTE — Telephone Encounter (Signed)
Pt left message with more questions from results given from 05/08/18 visit. I left pt a message on her VM. KW CMA

## 2018-05-15 ENCOUNTER — Other Ambulatory Visit: Payer: Self-pay

## 2018-05-16 ENCOUNTER — Encounter: Payer: Self-pay | Admitting: Gynecology

## 2018-05-16 ENCOUNTER — Ambulatory Visit (INDEPENDENT_AMBULATORY_CARE_PROVIDER_SITE_OTHER): Payer: 59 | Admitting: Gynecology

## 2018-05-16 VITALS — BP 124/80

## 2018-05-16 DIAGNOSIS — A630 Anogenital (venereal) warts: Secondary | ICD-10-CM | POA: Diagnosis not present

## 2018-05-16 NOTE — Progress Notes (Signed)
    Kaitlin Nelson [birth year] [id number]        22 y.o.  G0P0000 presents with her partner having recently had 2 areas excised which was suspicious for condyloma on histology without atypia.  She is noticed 2 other bumps in the perianal region.  Past medical history,surgical history, problem list, medications, allergies, family history and social history were all reviewed and documented in the EPIC chart.  Directed ROS with pertinent positives and negatives documented in the history of present illness/assessment and plan.  Exam: Kaitlin Nelson assistant Vitals:   05/16/18 1237  BP: 124/80   General appearance:  Normal External BUS vagina with 2 small condylomatous appearing raised areas 9:00 perianal region.  No other lesions noted.  Assessment/Plan:  23 y.o. 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  Options for management reviewed with the patient.  We discussed HPV in detail and the natural history.  Options for observation awaiting spontaneous resolution, topical treatment such as TCA, Aldara, excision reviewed.  At this point the patient prefers observation.  She will monitor these areas and follow-up if they change or more areas develop.  Assuming they spontaneously resolve then she will follow.  I spent a total of 15 face-to-face minutes with the patient, over 50% was spent counseling and coordination of care.   Kaitlin Auerbach MD, 1:08 PM 05/16/2018

## 2018-05-16 NOTE — Patient Instructions (Signed)
Follow the 2 areas as we discussed.  Follow-up if they change or more areas develop.

## 2018-09-16 ENCOUNTER — Ambulatory Visit: Payer: 59 | Admitting: Family

## 2018-09-16 DIAGNOSIS — Z0289 Encounter for other administrative examinations: Secondary | ICD-10-CM

## 2018-10-07 ENCOUNTER — Other Ambulatory Visit: Payer: Self-pay

## 2018-10-09 ENCOUNTER — Ambulatory Visit (INDEPENDENT_AMBULATORY_CARE_PROVIDER_SITE_OTHER): Payer: 59 | Admitting: Gynecology

## 2018-10-09 ENCOUNTER — Encounter: Payer: Self-pay | Admitting: Gynecology

## 2018-10-09 ENCOUNTER — Other Ambulatory Visit: Payer: Self-pay

## 2018-10-09 VITALS — BP 124/80

## 2018-10-09 DIAGNOSIS — N921 Excessive and frequent menstruation with irregular cycle: Secondary | ICD-10-CM

## 2018-10-09 DIAGNOSIS — N946 Dysmenorrhea, unspecified: Secondary | ICD-10-CM

## 2018-10-09 MED ORDER — NORETHIN ACE-ETH ESTRAD-FE 1-20 MG-MCG PO TABS: 1.0000 | ORAL_TABLET | Freq: Every day | ORAL | 1 refills | Status: DC

## 2018-10-09 NOTE — Patient Instructions (Signed)
Follow up for ultrasound as scheduled 

## 2018-10-09 NOTE — Progress Notes (Signed)
    Kaitlin Nelson [birth year] [id number]        23 y.o.  G0P0000 presents complaining of worsening dysmenorrhea and menorrhagia.  Notes her periods are getting heavier lasting 5 days.  Some irregularity and when they start but they occur monthly.  Mother and sister have history of endometriosis.  She was worried that she may have endometriosis.  Worried about fertility in the future.  Currently in same-sex relationship.  Past medical history,surgical history, problem list, medications, allergies, family history and social history were all reviewed and documented in the EPIC chart.  Directed ROS with pertinent positives and negatives documented in the history of present illness/assessment and plan.  Exam: Caryn Bee assistant Vitals:   10/09/18 1055  BP: 124/80   General appearance:  Normal Abdomen soft nontender without masses guarding rebound Pelvic external BUS vagina with light menses flow.  Cervix normal.  Uterus grossly normal midline mobile nontender.  Adnexa without masses or tenderness. Rectal exam is normal.  Assessment/Plan:  23 y.o. G0P0000 with worsening dysmenorrhea and menorrhagia.  Family history of endometriosis.  We discussed endometriosis and the issues as far as diagnoses and treatment.  We also discussed the issues of infertility associated with endometriosis and whether treatment ultrasound in the future.  We discussed laparoscopy for definitive diagnoses and possible treatment if encountered.  We also discussed the issues of recurrence risk in the future.  Ultimately we decided to start on low-dose oral contraceptives for symptom relief and baseline pelvic ultrasound for ovarian/pelvic surveillance.  She understands that we will not see subtle endometriosis but may detect endometriomas or other pelvic pathology that could cause pain.  We discussed every other to every third month withdrawal on oral contraceptives, off brand labeling.  Loestrin 1/20 equivalent prescribed  with 3 packs and 1 refill.  Patient will follow-up for ultrasound.  She will go ahead and start her pills now as she is on a menses.    Anastasio Auerbach MD, 11:13 AM 10/09/2018

## 2018-10-13 ENCOUNTER — Other Ambulatory Visit: Payer: 59

## 2018-10-13 ENCOUNTER — Ambulatory Visit: Payer: 59 | Admitting: Gynecology

## 2018-11-13 ENCOUNTER — Other Ambulatory Visit: Payer: Self-pay

## 2018-11-13 ENCOUNTER — Encounter: Payer: Self-pay | Admitting: Neurology

## 2018-11-13 ENCOUNTER — Ambulatory Visit (INDEPENDENT_AMBULATORY_CARE_PROVIDER_SITE_OTHER): Payer: 59 | Admitting: Neurology

## 2018-11-13 DIAGNOSIS — G43019 Migraine without aura, intractable, without status migrainosus: Secondary | ICD-10-CM | POA: Diagnosis not present

## 2018-11-13 DIAGNOSIS — E538 Deficiency of other specified B group vitamins: Secondary | ICD-10-CM | POA: Diagnosis not present

## 2018-11-13 DIAGNOSIS — R413 Other amnesia: Secondary | ICD-10-CM | POA: Diagnosis not present

## 2018-11-13 HISTORY — DX: Migraine without aura, intractable, without status migrainosus: G43.019

## 2018-11-13 HISTORY — DX: Deficiency of other specified B group vitamins: E53.8

## 2018-11-13 MED ORDER — ALPRAZOLAM 0.5 MG PO TABS: ORAL_TABLET | ORAL | 0 refills | Status: DC

## 2018-11-13 NOTE — Progress Notes (Signed)
Reason for visit: Memory disorder, migraine headache  Referring physician: Dr. Joelyn Nelson is a 23 y.o. female  History of present illness:  Kaitlin Nelson is a 23 year old right-handed white female with a history of bipolar disorder.  The patient is followed through psychiatry for this.  The patient claims that when she was 15 she had several concussions playing rugby, she claims that her problems with focus and concentration had started at that time.  The patient believes that her abilities to take in new information is gradually declining over time.  She is working but she is having some issues at work, and she is also in school.  She has having trouble she claims with directions with driving, she cannot retain new information well.  She has difficulty at times knowing what the date is and keeping up with medications and appointments.  She has word finding troubles and difficulty remembering names for people.  The patient may be driving a car and cannot remember how she went from one place to the next, she will daydream frequently.  She has difficulty focusing when she is trying to read.  She claims that she sleeps fairly well at night but then has fatigue during the day, but not true daytime drowsiness.  She was found to have a low vitamin B12 level, she is now on oral supplementation.  She reports that she also gets frequent headaches, 3-4 times a week.  She may have problems with focusing during the headache as well.  The headaches are usually on the right greater than left side of the head, usually behind the eye.  She will have photophobia and phonophobia and some nausea without vomiting.  She will occasionally miss work because of the headache.  She just recently was placed on Topamax, she just got up to 50 mg at night.  She takes Imitrex for the headache without much benefit.  She claims that her mother and her sister also had a history of migraine.  She reports no numbness or  weakness of extremities, she does note some mild gait instability and some difficulty with urinary frequency.  She comes to this office for an evaluation.  She does have some OCD tendencies.  Past Medical History:  Diagnosis Date   Acid reflux    Anxiety    Arthritis    Asthma    Bipolar disorder ([place])    Crohn disease ([place])    Depression    Scoliosis     Past Surgical History:  Procedure Laterality Date   CHOLECYSTECTOMY     THYROGLOSSAL DUCT CYST N/A 05/12/2018   Procedure: REMOVAL OF THYROID DUCT CYST;  Surgeon: Rozetta Nunnery, MD;  Location: [facility];  Service: ENT;  Laterality: N/A;   TUMOR REMOVAL     tumor removed from neck      Family History  Problem Relation Age of Onset   Hypertension Mother    Bipolar disorder Mother     Social history:  reports that she quit smoking about 10 months ago. Her smoking use included cigarettes. She has a 1.50 pack-year smoking history. She has never used smokeless tobacco. She reports current alcohol use. She reports current drug use. Drug: Marijuana.  Medications:  Prior to Admission medications   Medication Sig Start Date End Date Taking? Authorizing Provider  albuterol (VENTOLIN HFA) 108 (90 Base) MCG/ACT inhaler Inhale into the lungs every 6 (six) hours as needed for wheezing or shortness of breath.  Yes Joline Salt, RN  busPIRone (BUSPAR) 15 MG tablet Take 15 mg by mouth 3 (three) times daily.   Yes [provider]  ibuprofen (ADVIL,MOTRIN) 800 MG tablet Take 1 tablet (800 mg total) by mouth every 8 (eight) hours as needed. 05/08/18  Yes Fontaine, Belinda Block, MD  lamoTRIgine (LAMICTAL) 25 MG tablet Take 200 mg by mouth daily.    Yes Joline Salt, RN  meloxicam (MOBIC) 15 MG tablet Take 15 mg by mouth daily.   Yes [provider]  omeprazole (PRILOSEC) 20 MG capsule Take 20 mg by mouth daily.   Yes [provider]  ondansetron (ZOFRAN) 8 MG tablet Take by mouth  every 8 (eight) hours as needed for nausea or vomiting.   Yes [provider]  SUMAtriptan (IMITREX) 100 MG tablet 1 TABLET AT ONSET OF MIGRAINE AND THEN AGAIN 2 HOURS LATER IF NEEDED TWICE A DAY ORALLY 30 DAYS 10/28/18  Yes [provider]  topiramate (TOPAMAX) 25 MG tablet TAKE 1 2 TABLETS ONCE A DAY IN BEDTIME ORALLY 30 DAY(S) 10/28/18  Yes [provider]     No Known Allergies  ROS:  Out of a complete 14 system review of symptoms, the patient complains only of the following symptoms, and all other reviewed systems are negative.  Fatigue Memory troubles Balance problems Anxiety, depression  Blood pressure 125/72, pulse 85, temperature 98.2 F (36.8 C), weight 225 lb (102.1 kg).  Physical Exam  General: The patient is alert and cooperative at the time of the examination.  The patient is markedly obese.  Eyes: Pupils are equal, round, and reactive to light. Discs are flat bilaterally.  Neck: The neck is supple, no carotid bruits are noted.  Respiratory: The respiratory examination is clear.  Cardiovascular: The cardiovascular examination reveals a regular rate and rhythm, no obvious murmurs or rubs are noted.  Skin: Extremities are without significant edema.  Neurologic Exam  Mental status: The patient is alert and oriented x 3 at the time of the examination. The patient has apparent normal recent and remote memory, with an apparently normal attention span and concentration ability.  Mini-Mental status examination done today shows a total score 30/30.  Cranial nerves: Facial symmetry is present. There is good sensation of the face to pinprick and soft touch bilaterally. The strength of the facial muscles and the muscles to head turning and shoulder shrug are normal bilaterally. Speech is well enunciated, no aphasia or dysarthria is noted. Extraocular movements are full. Visual fields are full. The tongue is midline, and the patient has symmetric elevation  of the soft palate. No obvious hearing deficits are noted.  Motor: The motor testing reveals 5 over 5 strength of all 4 extremities. Good symmetric motor tone is noted throughout.  Sensory: Sensory testing is intact to pinprick, soft touch, vibration sensation, and position sense on all 4 extremities. No evidence of extinction is noted.  Coordination: Cerebellar testing reveals good finger-nose-finger and heel-to-shin bilaterally.  Gait and station: Gait is normal. Tandem gait is normal. Romberg is negative. No drift is seen.  Reflexes: Deep tendon reflexes are symmetric and normal bilaterally. Toes are downgoing bilaterally.   Assessment/Plan:  1.  Bipolar disorder, anxiety and depression  2.  Vitamin B12 deficiency on oral supplementation  3.  Past history of concussions  4.  Reported memory disturbance  5.  OCD tendencies  The patient reports problems with focusing, she very well may have an underlying attention deficit disorder.  She  is now on vitamin B12 supplementation for vitamin B12 deficiency, she does have underlying issues with anxiety and depression.  She is overweight and could be at risk for sleep apnea.  The patient will be sent for MRI of the brain, she will have neuropsychological testing.  If the testing reveals some attention deficit issues, a trial on Adderall or Vyvanse may be indicated.  The patient will follow-up here in about 4 months.The patient will follow up in 4 months.  Jill Alexanders MD 11/13/2018 3:15 PM  [facility] [address] [place], [place] [address]  Phone [phone] Fax [phone]

## 2018-11-17 ENCOUNTER — Encounter: Payer: Self-pay | Admitting: Gynecology

## 2018-11-18 ENCOUNTER — Telehealth: Payer: Self-pay | Admitting: Neurology

## 2018-11-18 NOTE — Telephone Encounter (Signed)
no to the covid questions MR Brain w/wo contrast Dr. Jannifer Franklin [facility] Auth: [place] via [facility] website. Patient is scheduled at [facility] for 11/25/18.

## 2018-11-25 ENCOUNTER — Ambulatory Visit: Payer: 59

## 2018-11-25 ENCOUNTER — Other Ambulatory Visit: Payer: Self-pay

## 2018-11-25 DIAGNOSIS — R413 Other amnesia: Secondary | ICD-10-CM | POA: Diagnosis not present

## 2018-11-25 MED ORDER — GADOBENATE DIMEGLUMINE 529 MG/ML IV SOLN
20.0000 mL | Freq: Once | INTRAVENOUS | Status: AC | PRN
  Administered 2018-11-25: 17:00:00 20 mL via INTRAVENOUS

## 2018-11-27 ENCOUNTER — Telehealth: Payer: Self-pay | Admitting: Neurology

## 2018-11-27 NOTE — Telephone Encounter (Signed)
I called the patient.  MRI of the brain was normal.  Neuropsychological testing has been set up.    MRI brain 11/27/18:  IMPRESSION:   Normal MRI brain (with and without).

## 2018-12-27 ENCOUNTER — Encounter (HOSPITAL_COMMUNITY): Payer: Self-pay | Admitting: Emergency Medicine

## 2018-12-27 ENCOUNTER — Other Ambulatory Visit: Payer: Self-pay

## 2018-12-27 ENCOUNTER — Emergency Department (HOSPITAL_COMMUNITY)
Admission: EM | Admit: 2018-12-27 | Discharge: 2018-12-28 | Disposition: A | Discharge status: Home / Self Care | Payer: 59 | Attending: Emergency Medicine | Admitting: Emergency Medicine

## 2018-12-27 DIAGNOSIS — J45909 Unspecified asthma, uncomplicated: Secondary | ICD-10-CM | POA: Insufficient documentation

## 2018-12-27 DIAGNOSIS — Y929 Unspecified place or not applicable: Secondary | ICD-10-CM | POA: Diagnosis not present

## 2018-12-27 DIAGNOSIS — Y998 Other external cause status: Secondary | ICD-10-CM | POA: Diagnosis not present

## 2018-12-27 DIAGNOSIS — Z87891 Personal history of nicotine dependence: Secondary | ICD-10-CM | POA: Diagnosis not present

## 2018-12-27 DIAGNOSIS — Z79899 Other long term (current) drug therapy: Secondary | ICD-10-CM | POA: Insufficient documentation

## 2018-12-27 DIAGNOSIS — S0083XA Contusion of other part of head, initial encounter: Secondary | ICD-10-CM | POA: Diagnosis not present

## 2018-12-27 DIAGNOSIS — S0990XA Unspecified injury of head, initial encounter: Secondary | ICD-10-CM | POA: Diagnosis present

## 2018-12-27 DIAGNOSIS — Y9389 Activity, other specified: Secondary | ICD-10-CM | POA: Diagnosis not present

## 2018-12-27 DIAGNOSIS — T148XXA Other injury of unspecified body region, initial encounter: Secondary | ICD-10-CM

## 2018-12-27 DIAGNOSIS — F1210 Cannabis abuse, uncomplicated: Secondary | ICD-10-CM | POA: Diagnosis not present
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
## 2018-12-28 ENCOUNTER — Emergency Department (HOSPITAL_COMMUNITY): Payer: 59

## 2018-12-28 MED ORDER — ACETAMINOPHEN 500 MG PO TABS
1000.0000 mg | ORAL_TABLET | Freq: Once | ORAL | Status: AC
  Administered 2018-12-28: 1000 mg via ORAL
  Filled 2018-12-28: qty 2
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
## 2018-12-28 NOTE — Discharge Instructions (Signed)
You can take Tylenol or Ibuprofen as directed for pain. You can alternate Tylenol and Ibuprofen every 4 hours. If you take Tylenol at 1pm, then you can take Ibuprofen at 5pm. Then you can take Tylenol again at 9pm.   Apply ice to help with pain and swelling.  As we discussed, the bruising may get worse.  As we discussed, you should engage in postconcussive precautions.  This includes no physical activity for 2 weeks as well as brain rest.  This means limiting the amount of screen time, phone, computer time.  Follow-up with your primary care doctor in the next 2 to 4 days for further evaluation.  Return the emergency department for any vomiting, difficulty walking, numbness/weakness of arms or legs or any other worsening or concerning symptoms.

## 2019-01-20 ENCOUNTER — Encounter (INDEPENDENT_AMBULATORY_CARE_PROVIDER_SITE_OTHER): Payer: Self-pay | Admitting: Otolaryngology

## 2019-01-20 ENCOUNTER — Ambulatory Visit (INDEPENDENT_AMBULATORY_CARE_PROVIDER_SITE_OTHER): Payer: 59 | Admitting: Otolaryngology

## 2019-01-20 ENCOUNTER — Other Ambulatory Visit: Payer: Self-pay

## 2019-01-20 VITALS — Temp 97.5°F

## 2019-01-20 DIAGNOSIS — Q892 Congenital malformations of other endocrine glands: Secondary | ICD-10-CM | POA: Diagnosis not present
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
## 2019-02-06 ENCOUNTER — Encounter (HOSPITAL_BASED_OUTPATIENT_CLINIC_OR_DEPARTMENT_OTHER): Payer: Self-pay

## 2019-02-06 ENCOUNTER — Ambulatory Visit (HOSPITAL_BASED_OUTPATIENT_CLINIC_OR_DEPARTMENT_OTHER): Admit: 2019-02-06 | Payer: 59 | Admitting: Otolaryngology

## 2019-02-06 SURGERY — EXCISION, THYROGLOSSAL DUCT CYST
Anesthesia: General

## 2019-03-16 ENCOUNTER — Ambulatory Visit: Payer: 59 | Admitting: Neurology

## 2019-04-22 ENCOUNTER — Ambulatory Visit: Payer: Self-pay | Attending: Internal Medicine

## 2019-04-22 DIAGNOSIS — Z20822 Contact with and (suspected) exposure to covid-19: Secondary | ICD-10-CM | POA: Insufficient documentation

## 2019-04-23 ENCOUNTER — Ambulatory Visit: Payer: Self-pay

## 2019-04-23 LAB — NOVEL CORONAVIRUS, NAA: SARS-CoV-2, NAA: NOT DETECTED

## 2019-05-11 ENCOUNTER — Encounter: Payer: 59 | Admitting: Obstetrics and Gynecology

## 2019-12-15 ENCOUNTER — Ambulatory Visit (INDEPENDENT_AMBULATORY_CARE_PROVIDER_SITE_OTHER): Payer: No Payment, Other | Admitting: Licensed Clinical Social Worker

## 2019-12-15 ENCOUNTER — Encounter (HOSPITAL_COMMUNITY): Payer: Self-pay | Admitting: Licensed Clinical Social Worker

## 2019-12-15 ENCOUNTER — Other Ambulatory Visit: Payer: Self-pay

## 2019-12-15 DIAGNOSIS — F319 Bipolar disorder, unspecified: Secondary | ICD-10-CM | POA: Diagnosis not present

## 2019-12-15 DIAGNOSIS — F4310 Post-traumatic stress disorder, unspecified: Secondary | ICD-10-CM
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
## 2019-12-29 ENCOUNTER — Encounter (HOSPITAL_COMMUNITY): Payer: Self-pay

## 2019-12-29 ENCOUNTER — Other Ambulatory Visit: Payer: Self-pay

## 2019-12-29 ENCOUNTER — Telehealth (INDEPENDENT_AMBULATORY_CARE_PROVIDER_SITE_OTHER): Payer: No Payment, Other | Admitting: Psychiatry

## 2019-12-29 DIAGNOSIS — F319 Bipolar disorder, unspecified: Secondary | ICD-10-CM | POA: Diagnosis not present

## 2019-12-29 MED ORDER — LAMOTRIGINE 25 MG PO TABS: 25.0000 mg | ORAL_TABLET | Freq: Every day | ORAL | 0 refills | Status: DC

## 2019-12-29 MED ORDER — TRAZODONE HCL 50 MG PO TABS: 50.0000 mg | ORAL_TABLET | Freq: Every evening | ORAL | 0 refills | Status: DC | PRN
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
## 2020-01-29 ENCOUNTER — Ambulatory Visit (INDEPENDENT_AMBULATORY_CARE_PROVIDER_SITE_OTHER): Payer: No Payment, Other | Admitting: Licensed Clinical Social Worker

## 2020-01-29 ENCOUNTER — Other Ambulatory Visit: Payer: Self-pay

## 2020-01-29 DIAGNOSIS — F319 Bipolar disorder, unspecified: Secondary | ICD-10-CM

## 2020-01-29 DIAGNOSIS — F4310 Post-traumatic stress disorder, unspecified: Secondary | ICD-10-CM
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
## 2020-02-02 ENCOUNTER — Telehealth (INDEPENDENT_AMBULATORY_CARE_PROVIDER_SITE_OTHER): Payer: No Payment, Other | Admitting: Psychiatry

## 2020-02-02 ENCOUNTER — Other Ambulatory Visit: Payer: Self-pay

## 2020-02-02 ENCOUNTER — Encounter (HOSPITAL_COMMUNITY): Payer: Self-pay

## 2020-02-02 DIAGNOSIS — F3132 Bipolar disorder, current episode depressed, moderate: Secondary | ICD-10-CM | POA: Diagnosis not present

## 2020-02-02 MED ORDER — GABAPENTIN 100 MG PO CAPS: 100.0000 mg | ORAL_CAPSULE | Freq: Three times a day (TID) | ORAL | 2 refills | Status: DC

## 2020-02-02 MED ORDER — LAMOTRIGINE 25 MG PO TABS: 50.0000 mg | ORAL_TABLET | Freq: Two times a day (BID) | ORAL | 3 refills | Status: DC
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
## 2020-02-12 ENCOUNTER — Ambulatory Visit (HOSPITAL_COMMUNITY): Payer: Self-pay | Admitting: Licensed Clinical Social Worker

## 2020-03-14 ENCOUNTER — Ambulatory Visit (HOSPITAL_COMMUNITY): Payer: No Payment, Other | Admitting: Licensed Clinical Social Worker

## 2020-03-14 ENCOUNTER — Other Ambulatory Visit: Payer: Self-pay

## 2020-03-15 ENCOUNTER — Telehealth (HOSPITAL_COMMUNITY): Payer: Self-pay | Admitting: Licensed Clinical Social Worker

## 2020-03-15 NOTE — Telephone Encounter (Signed)
Addendum from 1/17. LCSW sent two link to pt phone with no response. LCSW f/u wit PC and left HIPAA compliant VM

## 2020-04-04 ENCOUNTER — Encounter (HOSPITAL_COMMUNITY): Payer: Self-pay

## 2020-04-04 ENCOUNTER — Other Ambulatory Visit: Payer: Self-pay

## 2020-04-04 ENCOUNTER — Telehealth (INDEPENDENT_AMBULATORY_CARE_PROVIDER_SITE_OTHER): Payer: No Payment, Other | Admitting: Psychiatry

## 2020-04-04 DIAGNOSIS — F4310 Post-traumatic stress disorder, unspecified: Secondary | ICD-10-CM

## 2020-04-04 DIAGNOSIS — F319 Bipolar disorder, unspecified: Secondary | ICD-10-CM

## 2020-04-04 MED ORDER — LAMOTRIGINE 100 MG PO TABS: 100.0000 mg | ORAL_TABLET | Freq: Two times a day (BID) | ORAL | 2 refills | Status: DC
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
## 2020-04-07 ENCOUNTER — Ambulatory Visit (HOSPITAL_COMMUNITY): Payer: No Payment, Other | Admitting: Licensed Clinical Social Worker

## 2020-04-07 ENCOUNTER — Other Ambulatory Visit: Payer: Self-pay

## 2020-05-02 ENCOUNTER — Encounter (HOSPITAL_COMMUNITY): Payer: Self-pay

## 2020-05-02 ENCOUNTER — Telehealth (INDEPENDENT_AMBULATORY_CARE_PROVIDER_SITE_OTHER): Payer: No Payment, Other | Admitting: Psychiatry

## 2020-05-02 ENCOUNTER — Other Ambulatory Visit: Payer: Self-pay

## 2020-05-02 DIAGNOSIS — F3132 Bipolar disorder, current episode depressed, moderate: Secondary | ICD-10-CM | POA: Diagnosis not present
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
## 2020-05-18 ENCOUNTER — Other Ambulatory Visit: Payer: Self-pay

## 2020-05-18 ENCOUNTER — Ambulatory Visit (INDEPENDENT_AMBULATORY_CARE_PROVIDER_SITE_OTHER): Payer: No Payment, Other | Admitting: Licensed Clinical Social Worker

## 2020-05-18 DIAGNOSIS — F319 Bipolar disorder, unspecified: Secondary | ICD-10-CM | POA: Diagnosis not present

## 2020-05-18 DIAGNOSIS — F4310 Post-traumatic stress disorder, unspecified: Secondary | ICD-10-CM
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
## 2020-06-06 ENCOUNTER — Ambulatory Visit (INDEPENDENT_AMBULATORY_CARE_PROVIDER_SITE_OTHER): Payer: No Payment, Other | Admitting: Licensed Clinical Social Worker

## 2020-06-06 ENCOUNTER — Encounter (HOSPITAL_COMMUNITY): Payer: Self-pay | Admitting: Licensed Clinical Social Worker

## 2020-06-06 ENCOUNTER — Other Ambulatory Visit: Payer: Self-pay

## 2020-06-06 DIAGNOSIS — F319 Bipolar disorder, unspecified: Secondary | ICD-10-CM | POA: Diagnosis not present

## 2020-06-06 NOTE — Progress Notes (Signed)
   THERAPIST PROGRESS NOTE  Session Time: 83  Participation Level: Active  Behavioral Response: NAAlertAnxious  Type of Therapy: Individual Therapy  Treatment Goals addressed: Diagnosis: bipolar depression   Interventions: CBT and Supportive  Summary: Kaitlin Nelson is a 25 y.o. female who presents with bipolar depression .   Suicidal/Homicidal: NAwithout intent/plan  Virtual Visit via Telephone Note  I connected with Kaitlin Nelson on 06/06/20 at  8:00 AM EDT by telephone and verified that I am speaking with the correct person using two identifiers.  Location: Patient: [facility] Provider: [facility]    I discussed the limitations, risks, security and privacy concerns of performing an evaluation and management service by telephone and the availability of in person appointments. I also discussed with the patient that there may be a patient responsible charge related to this service. The patient expressed understanding and agreed to proceed.   Therapist Response:   Subjective/Objective:   Pt was alert and oriented x 5. She was dressed casually and engaged well therapy session. Pt presented today with an anxious mood/affect. She was cooperative in session.   Pt reports that primary stressor is impulse control and compulsion tendencies. She states that she when she sits down her feet always need to be to the side. If she does not do it then she has anxiety throughout her day. She also reports that when she scratches one part of her face the other side needs to be scratched as well in the same spot. Kaitlin Nelson reports that she also tends to ramble during conversation without pause, then gets hypersensitive to people judgment of her.   Plan: Pt will look up material on OCD personality disorder and OCD. Pt will then report back on those symptoms. She will also give material to watch the movie "Aviator" to better understand OCD    Assessment/Plan: Pt endorses symptoms for  ritualistic tendencies that interrupt daily living, hopelessness, worthlessness, isolation, tension, worry, restlessness, and irritability. Currently she does meet criteria for bipolar depression. She is taking her medication as prescribed.     I discussed the assessment and treatment plan with the patient. The patient was provided an opportunity to ask questions and all were answered. The patient agreed with the plan and demonstrated an understanding of the instructions.   The patient was advised to call back or seek an in-person evaluation if the symptoms worsen or if the condition fails to improve as anticipated.  I provided 45 minutes of non-face-to-face time during this encounter.   Dory Horn, LCSW   Plan: Return again in 45 weeks.     Dory Horn, LCSW 06/06/2020

## 2020-07-07 ENCOUNTER — Other Ambulatory Visit: Payer: Self-pay

## 2020-07-07 ENCOUNTER — Ambulatory Visit (INDEPENDENT_AMBULATORY_CARE_PROVIDER_SITE_OTHER): Payer: No Payment, Other | Admitting: Licensed Clinical Social Worker

## 2020-07-07 DIAGNOSIS — F319 Bipolar disorder, unspecified: Secondary | ICD-10-CM | POA: Diagnosis not present

## 2020-07-07 DIAGNOSIS — F4310 Post-traumatic stress disorder, unspecified: Secondary | ICD-10-CM | POA: Diagnosis not present
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
## 2020-08-15 ENCOUNTER — Other Ambulatory Visit: Payer: Self-pay

## 2020-08-15 ENCOUNTER — Ambulatory Visit (INDEPENDENT_AMBULATORY_CARE_PROVIDER_SITE_OTHER): Payer: No Payment, Other | Admitting: Licensed Clinical Social Worker

## 2020-08-15 DIAGNOSIS — F319 Bipolar disorder, unspecified: Secondary | ICD-10-CM

## 2020-08-15 DIAGNOSIS — F4310 Post-traumatic stress disorder, unspecified: Secondary | ICD-10-CM

## 2020-08-15 NOTE — Progress Notes (Signed)
   THERAPIST PROGRESS NOTE  Session Time: 73   Virtual Visit via Video Note  I connected with Kaitlin Nelson on 08/15/20 at 10:00 AM EDT by a video enabled telemedicine application and verified that I am speaking with the correct person using two identifiers.  Location: Patient: [name] Provider: [facility]    I discussed the limitations of evaluation and management by telemedicine and the availability of in person appointments. The patient expressed understanding and agreed to proceed.    I discussed the assessment and treatment plan with the patient. The patient was provided an opportunity to ask questions and all were answered. The patient agreed with the plan and demonstrated an understanding of the instructions.   The patient was advised to call back or seek an in-person evaluation if the symptoms worsen or if the condition fails to improve as anticipated.  I provided 45 minutes of non-face-to-face time during this encounter.   Dory Horn, LCSW   Participation Level: Active  Behavioral Response: CasualAlertAnxious and Depressed  Type of Therapy: Individual Therapy  Treatment Goals addressed: Diagnosis: bipolar disorder   Interventions: CBT and Supportive  Summary: Kaitlin Nelson is a 25 y.o. female who presents with bipolar disorder    Suicidal/Homicidal: NAwithout intent/plan  Therapist Response:    Subjective/Objective: Pt was alert and oriented x 5. She was dressed casually and engaged well in therapy session. Pt presented with a depressed and anxious mood/affect.   Primary stressor is increase in depressive symptoms. She denies SI/HI. Jacqlyn states she has not been taking her medications. She endorses symptoms for increase depression. LCSW inquired about why she has stopped taking them. Pt stated, "It was a snowball effect". She forgot to take them and stated she felt fine, but after several weeks she started to feel the increase in  symptoms.   Intervention/Plan: LCSW used supportive therapy for normalizing, positive reinforcement, and encouragement. Plan for today's session was solution focused on re starting medications. Pt to call [facility] to get reestablished with medications management and start taking medications again. Jaionna was agreeable to plan.    Assessment/Plan: Pt endorses symptoms for depression as isolation, irritability, fatigue, over sleeping, tension and worry. She does meet criteria for bipolar disorder currently depressed. Plan is listed above and pt to f/u for therapy in 4 weeks.    Plan: Return again in 4 weeks.      Dory Horn, LCSW 08/15/2020

## 2020-09-12 ENCOUNTER — Other Ambulatory Visit: Payer: Self-pay

## 2020-09-12 ENCOUNTER — Ambulatory Visit (INDEPENDENT_AMBULATORY_CARE_PROVIDER_SITE_OTHER): Payer: No Payment, Other | Admitting: Licensed Clinical Social Worker

## 2020-09-12 DIAGNOSIS — F4310 Post-traumatic stress disorder, unspecified: Secondary | ICD-10-CM

## 2020-09-12 DIAGNOSIS — F319 Bipolar disorder, unspecified: Secondary | ICD-10-CM
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
## 2020-10-11 ENCOUNTER — Ambulatory Visit (HOSPITAL_COMMUNITY): Payer: No Payment, Other | Admitting: Licensed Clinical Social Worker

## 2020-10-12 ENCOUNTER — Ambulatory Visit (HOSPITAL_COMMUNITY): Payer: No Payment, Other | Admitting: Licensed Clinical Social Worker

## 2020-10-12 DIAGNOSIS — F4310 Post-traumatic stress disorder, unspecified: Secondary | ICD-10-CM

## 2020-10-12 DIAGNOSIS — F319 Bipolar disorder, unspecified: Secondary | ICD-10-CM

## 2020-10-12 NOTE — Progress Notes (Signed)
   THERAPIST PROGRESS NOTE  Session Time: 73  Virtual Visit via Video Note  I connected with Kaitlin Nelson on 10/12/20 at  1:00 PM EDT by a video enabled telemedicine application and verified that I am speaking with the correct person using two identifiers.  Location: Patient: Kaitlin Nelson  Provider: [facility]    I discussed the limitations of evaluation and management by telemedicine and the availability of in person appointments. The patient expressed understanding and agreed to proceed.     I discussed the assessment and treatment plan with the patient. The patient was provided an opportunity to ask questions and all were answered. The patient agreed with the plan and demonstrated an understanding of the instructions.   The patient was advised to call back or seek an in-person evaluation if the symptoms worsen or if the condition fails to improve as anticipated.  I provided 45 minutes of non-face-to-face time during this encounter.   Dory Horn, LCSW   Participation Level: Active  Behavioral Response: NAAlertEuthymic  Type of Therapy: Individual Therapy  Treatment Goals addressed: Anxiety and Diagnosis: depression  Interventions: CBT and Supportive  Summary: Kaitlin Nelson is a 25 y.o. female who presents with depressed\anxious mood and affect.  Kaitlin Nelson was alert and oriented x5.  Patient was cooperative, pleasant, and maintained good eye contact.  Primary stressor for Kaitlin Nelson in today's session was an increase in depression.  This is as evidenced by PHQ-9 score at 20 last known score was 18.  Patient reports an increase in suicidal ideations although denies any intent or plan at this time.  LCSW did safety plan with patient and this is charted in the person centered treatment plan.  Kaitlin Nelson reports that she has called the 1 800 suicide hotline 1 time in the last 60 days she reports that this helps her get through a crisis and stabilized her mood.   Patient  reports other stressors for working in school.  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  Patient's normal working hours are from 3 PM to 11 PM which benefits her with her school as Monday Wednesday and Friday she gets out by noon and Tuesdays and Thursdays she is out by 10 AM.  Kaitlin Nelson reports that she is interested in getting back on her medications as she has not been taking them since April.  LCSW did inquire about a medication management appointment with Waylan Boga nurse practitioner with [facility] and an appointment was made.    Suicidal/Homicidal: NAwithout intent/plan  Therapist Response:    Intervention/Plan: LCSW administered supportive and solution focused therapy is in today's session.  Solution focused therapy was utilized for crisis intervention hotlines for National suicide prevention hotline phone number and also mobile crisis unit through [employer].  Resources were also verbalized to patient for the behavioral health urgent care on [address] if thoughts and feelings for suicide became action or plan.  Kaitlin Nelson was verbally agreeable to utilize those resources if feelings became overwhelming.  Supportive therapy was utilized for encouragement, praise, and advice giving.  Plan for patient is to follow-up with medication provider in the next 30 days and follow-up with LCSW in 4 weeks.  Treatment plan was updated in today's session  Plan: Return again in 4 weeks.      Dory Horn, LCSW 10/12/2020

## 2020-10-24 ENCOUNTER — Other Ambulatory Visit: Payer: Self-pay

## 2020-10-24 ENCOUNTER — Telehealth (HOSPITAL_COMMUNITY): Payer: No Payment, Other | Admitting: Family

## 2020-10-24 NOTE — Progress Notes (Unsigned)
Unable to reach patient via caregility- left HIPPA safe message. Patient to reschedule appointment

## 2020-10-25 ENCOUNTER — Encounter (HOSPITAL_COMMUNITY): Payer: Self-pay | Admitting: Family

## 2020-11-08 ENCOUNTER — Other Ambulatory Visit: Payer: Self-pay

## 2020-11-08 ENCOUNTER — Ambulatory Visit (HOSPITAL_COMMUNITY): Payer: No Payment, Other | Admitting: Licensed Clinical Social Worker

## 2020-11-08 ENCOUNTER — Telehealth (HOSPITAL_COMMUNITY): Payer: Self-pay | Admitting: Licensed Clinical Social Worker

## 2020-11-08 NOTE — Telephone Encounter (Signed)
LCSW sent 3 links to patient phone with no response.  After no response LCSW followed up with phone call at 1410, there was no answer.  Answering machine had patient's name on it LCSW left voicemail for patient.  LCSW waited until 1415 before disconnecting from virtual chat appointment will be considered a no-show.

## 2020-11-21 ENCOUNTER — Telehealth (HOSPITAL_COMMUNITY): Payer: No Payment, Other

## 2020-11-21 ENCOUNTER — Other Ambulatory Visit: Payer: Self-pay

## 2020-12-01 ENCOUNTER — Ambulatory Visit (INDEPENDENT_AMBULATORY_CARE_PROVIDER_SITE_OTHER): Payer: No Payment, Other | Admitting: Licensed Clinical Social Worker

## 2020-12-01 ENCOUNTER — Other Ambulatory Visit: Payer: Self-pay

## 2020-12-01 DIAGNOSIS — F4310 Post-traumatic stress disorder, unspecified: Secondary | ICD-10-CM

## 2020-12-01 DIAGNOSIS — F319 Bipolar disorder, unspecified: Secondary | ICD-10-CM
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
## 2020-12-12 ENCOUNTER — Other Ambulatory Visit: Payer: Self-pay

## 2020-12-12 ENCOUNTER — Encounter (HOSPITAL_COMMUNITY): Payer: Self-pay

## 2020-12-12 ENCOUNTER — Telehealth (INDEPENDENT_AMBULATORY_CARE_PROVIDER_SITE_OTHER): Payer: No Payment, Other | Admitting: Psychiatry

## 2020-12-12 DIAGNOSIS — F3132 Bipolar disorder, current episode depressed, moderate: Secondary | ICD-10-CM | POA: Diagnosis not present

## 2020-12-12 DIAGNOSIS — F4310 Post-traumatic stress disorder, unspecified: Secondary | ICD-10-CM

## 2020-12-12 MED ORDER — TRAZODONE HCL 50 MG PO TABS: 50.0000 mg | ORAL_TABLET | Freq: Every evening | ORAL | 2 refills | Status: DC | PRN

## 2020-12-12 MED ORDER — LAMOTRIGINE 25 MG PO TABS: 25.0000 mg | ORAL_TABLET | Freq: Two times a day (BID) | ORAL | 1 refills | Status: DC

## 2020-12-12 MED ORDER — GABAPENTIN 100 MG PO CAPS: 200.0000 mg | ORAL_CAPSULE | Freq: Three times a day (TID) | ORAL | 2 refills | Status: DC

## 2020-12-12 NOTE — Progress Notes (Signed)
[name] [facility] NP Progress Notes  Patient Identification: Kaitlin Nelson MRN:  [id number] Date of Evaluation:  12/12/2020 Referral Source: [facility] Chief Complaint:   Chief Complaint   Anxiety; Depression; Follow-up   Depression, anxiety Visit Diagnosis: Bipolar d/o, depression, moderate  History of Present Illness:  25 y/o female presents today for follow up.  She reports mood is  "low, depressed", last few months have been tough, having "lot of bad days". She stopped taking her meds few months ago. Feels she is "all over the place" when she is off her meds. 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  Sleep "on and off", sleeping 4-5 hours some nights. She reports having a "breakdown" "few months ago" and called the national crisis hotline.  Social support includes wife and friends. She reports having marital "problems", plans to have couples therapy. Patient sees therapist once a month, interested in seeing therapist more frequently. She reports recent loss of a friend who which has been challenging and stressful. She reports passive intermittent suicidal thoughts. She denies any plan or intent. She is hopeful for the future with academic goals.  Depression and anxiety are at high levels, occasional panic attack.  Appetite is "good".  Discussed [facility] counseling center and seeing an additional therapist weekly along with their crisis information.  Also discussed a safety plan if her suicidal ideations increase or gets to the point of a plan.  She has multiple stressors going on currently with college, marital problems   Follow-up in 1 month to reevaluate symptoms, sooner if needed.  Past Psychiatric History: bipolar d/o, PTSD  Previous Psychotropic Medications: Yes   Substance Abuse History in the last 12 months:  No.  Consequences of Substance Abuse: NA  Past Medical History:  Past Medical History:  Diagnosis Date   Acid reflux    Anxiety    Arthritis     Asthma    Bipolar disorder ([place])    Common migraine with intractable migraine 11/13/2018   Crohn disease ([place])    Depression    Scoliosis    Vitamin B12 deficiency 11/13/2018    Past Surgical History:  Procedure Laterality Date   CHOLECYSTECTOMY     THYROGLOSSAL DUCT CYST N/A 05/12/2018   Procedure: REMOVAL OF THYROID DUCT CYST;  Surgeon: Rozetta Nunnery, MD;  Location: [facility];  Service: ENT;  Laterality: N/A;   TUMOR REMOVAL     tumor removed from neck      Family Psychiatric History: none   Family History:  Family History  Problem Relation Age of Onset   Hypertension Mother    Bipolar disorder Mother     Social History:   Social History   Socioeconomic History   Marital status: Single    Spouse name: Not on file   Number of children: Not on file   Years of education: Not on file   Highest education level: Not on file  Occupational History   Not on file  Tobacco Use   Smoking status: Former    Packs/day: 0.50    Years: 3.00    Pack years: 1.50    Types: Cigarettes    Quit date: 01/13/2018    Years since quitting: 2.9   Smokeless tobacco: Never  Vaping Use   Vaping Use: Never used  Substance and Sexual Activity   Alcohol use: Yes    Comment: rare   Drug use: Yes    Types: Marijuana    Comment:  1 joint before bedtime    Sexual activity: Yes    Partners: Female    Comment: Female Partner-1st intercourse 14 yo-More than 5 partners  Other Topics Concern   Not on file  Social History Narrative   Lives with Kaitlin Nelson.   Social Determinants of Health   Financial Resource Strain: Medium Risk   Difficulty of Paying Living Expenses: Somewhat hard  Food Insecurity: No Food Insecurity   Worried About Charity fundraiser in the Last Year: Never true   Ran Out of Food in the Last Year: Never true  Transportation Needs: No Transportation Needs   Lack of Transportation (Medical): No   Lack of Transportation (Non-Medical): No  Physical  Activity: Sufficiently Active   Days of Exercise per Week: 4 days   Minutes of Exercise per Session: 50 min  Stress: Stress Concern Present   Feeling of Stress : Very much  Social Connections: Moderately Integrated   Frequency of Communication with Friends and Family: Once a week   Frequency of Social Gatherings with Friends and Family: Twice a week   Attends Religious Services: Never   Marine scientist or Organizations: Yes   Attends Music therapist: More than 4 times per year   Marital Status: Married    Additional Social History: Lives with her wife and pets, college for physics  Allergies:   Allergies  Allergen Reactions   Hydrocodone-Acetaminophen Nausea And Vomiting    severe headache with dizziness   Psyllium Nausea Only and Nausea And Vomiting   Hydrocodone Swelling, Anxiety and Rash    Throbbing in head     Metabolic Disorder Labs: No results found for: HGBA1C, MPG No results found for: PROLACTIN No results found for: CHOL, TRIG, HDL, CHOLHDL, VLDL, LDLCALC No results found for: TSH  Therapeutic Level Labs: No results found for: LITHIUM No results found for: CBMZ No results found for: VALPROATE  Current Medications: Current Outpatient Medications  Medication Sig Dispense Refill   lamoTRIgine (LAMICTAL) 25 MG tablet Take 1 tablet (25 mg total) by mouth 2 (two) times daily. Take one tablet daily for one week, then take two tablets daily. 60 tablet 1   albuterol (VENTOLIN HFA) 108 (90 Base) MCG/ACT inhaler Inhale into the lungs every 6 (six) hours as needed for wheezing or shortness of breath.      gabapentin (NEURONTIN) 100 MG capsule Take 2 capsules (200 mg total) by mouth 3 (three) times daily. 180 capsule 2   ibuprofen (ADVIL,MOTRIN) 800 MG tablet Take 1 tablet (800 mg total) by mouth every 8 (eight) hours as needed. 60 tablet 3   traZODone (DESYREL) 50 MG tablet Take 1 tablet (50 mg total) by mouth at bedtime as needed for sleep. Take 50 mg  at bedtime, May repeat once dose in 1 hour if not asleep 60 tablet 2   No current facility-administered medications for this visit.    Musculoskeletal: Strength & Muscle Tone: within normal limits Gait & Station: normal Patient leans: N/A  Psychiatric Specialty Exam: Review of Systems  Psychiatric/Behavioral:  Positive for dysphoric mood. The patient is nervous/anxious.   All other systems reviewed and are negative.  There were no vitals taken for this visit.There is no height or weight on file to calculate BMI.  General Appearance: Casual  Eye Contact: Good  Speech:  Normal Rate  Volume:  Normal  Mood: Anxiety and depression  Affect:  Appropriate  Thought Process:  Coherent and Descriptions of Associations: Intact  Orientation:  Full (Time, Place, and Person)  Thought Content:  WDL and Logical  Suicidal Thoughts:  No  Homicidal Thoughts:  No  Memory:  Immediate;   Good Recent;   Good Remote;   Good  Judgement:  Good  Insight:  Good  Psychomotor Activity:  Normal  Concentration:  Concentration: Good and Attention Span: Good  Recall:  Good  Fund of Knowledge:Good  Language: Good  Akathisia:  No  Handed:  Right  AIMS (if indicated):  not done  Assets:  Housing Leisure Time Physical Health Resilience Social Support  ADL's:  Intact  Cognition: WNL  Sleep:  Fair   Screenings: AUDIT    Health and safety inspector from 12/15/2019 in [facility]  Alcohol Use Disorder Identification Test Final Score (AUDIT) 4      Mini-Mental    Flowsheet Row Office Visit from 11/13/2018 in [facility]  Total Score (max 30 points ) 30      PHQ2-9    Flowsheet Row Counselor from 10/12/2020 in [facility] Counselor from 06/06/2020 in [facility] Video Visit from 12/29/2019 in [facility] Counselor from 12/15/2019 in [place]  PHQ-2 Total Score 5 4 6 4   PHQ-9 Total Score 20 18 23 19       Flowsheet Row Counselor from 10/12/2020 in [facility] Counselor from 06/06/2020 in [facility] Counselor from 12/15/2019 in [place] No Risk Low Risk       Assessment and Plan:  Bipolar affective disorder, depressed, moderate: -Restart Lamictal at 25 mg daily for one week and then 50 mg daily -Follow up in one month -Continue therapy and establish additional services at [facility] center  Anxiety: -Increase gabapentin 100 mg TID PRN to 200 mg TID PRN -Practice mindfulness activities  Attention issues: -ADHD testing referral  Insomnia: -Continue Trazodone 50 mg at bedtime PRN, may repeat once in an hour if need  Virtual Visit via Video Note  I connected with Kaitlin Nelson on 12/12/20 at 10:00 AM EDT by a video enabled telemedicine application and verified that I am speaking with the correct person using two identifiers.  Location: Patient: home Provider: home office   I discussed the limitations of evaluation and management by telemedicine and the availability of in person appointments. The patient expressed understanding and agreed to proceed.  Follow Up Instructions: Follow up in one month   I discussed the assessment and treatment plan with the patient. The patient was provided an opportunity to ask questions and all were answered. The patient agreed with the plan and demonstrated an understanding of the instructions.   The patient was advised to call back or seek an in-person evaluation if the symptoms worsen or if the condition fails to improve as anticipated.  I provided 20 minutes of non-face-to-face time during this encounter.   Waylan Boga, NP  Waylan Boga, NP 10/17/202210:16 AM

## 2021-01-02 ENCOUNTER — Ambulatory Visit (INDEPENDENT_AMBULATORY_CARE_PROVIDER_SITE_OTHER): Payer: No Payment, Other | Admitting: Licensed Clinical Social Worker

## 2021-01-02 ENCOUNTER — Other Ambulatory Visit: Payer: Self-pay

## 2021-01-02 DIAGNOSIS — F319 Bipolar disorder, unspecified: Secondary | ICD-10-CM | POA: Diagnosis not present
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
## 2021-01-26 ENCOUNTER — Ambulatory Visit (INDEPENDENT_AMBULATORY_CARE_PROVIDER_SITE_OTHER): Payer: No Payment, Other | Admitting: Licensed Clinical Social Worker

## 2021-01-26 DIAGNOSIS — F319 Bipolar disorder, unspecified: Secondary | ICD-10-CM | POA: Diagnosis not present

## 2021-01-26 DIAGNOSIS — F4310 Post-traumatic stress disorder, unspecified: Secondary | ICD-10-CM
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
## 2021-03-02 ENCOUNTER — Ambulatory Visit (HOSPITAL_COMMUNITY): Payer: BC Managed Care – PPO | Admitting: Licensed Clinical Social Worker

## 2021-03-02 DIAGNOSIS — F4310 Post-traumatic stress disorder, unspecified: Secondary | ICD-10-CM

## 2021-03-02 DIAGNOSIS — F319 Bipolar disorder, unspecified: Secondary | ICD-10-CM

## 2021-03-02 NOTE — Progress Notes (Signed)
° °  THERAPIST PROGRESS NOTE  Virtual Visit via Telephone Note  I connected with Kaitlin Nelson on 03/02/21 at  1:00 PM EST by telephone and verified that I am speaking with the correct person using two identifiers.  Location: Patient: [name]  Provider: Providers Home    I discussed the limitations, risks, security and privacy concerns of performing an evaluation and management service by telephone and the availability of in person appointments. I also discussed with the patient that there may be a patient responsible charge related to this service. The patient expressed understanding and agreed to proceed.     I discussed the assessment and treatment plan with the patient. The patient was provided an opportunity to ask questions and all were answered. The patient agreed with the plan and demonstrated an understanding of the instructions.   The patient was advised to call back or seek an in-person evaluation if the symptoms worsen or if the condition fails to improve as anticipated.  I provided 40 minutes of non-face-to-face time during this encounter.   Kaitlin Horn, LCSW   Participation Level: Active  Behavioral Response: CasualAlertAnxious and Depressed  Type of Therapy: Individual Therapy  Treatment Goals addressed: Anxiety and Coping  Interventions: CBT, Motivational Interviewing, Supportive, and Reframing   Suicidal/Homicidal: Nowithout intent/plan  Therapist Response:   Pt was alert and oriented x 5. She was not observed due to session being conducted via phone. Pt presented with anxious and restless mood. She engaged well in therapy session, was pleasant, and cooperative.   Pt stressor today are medications, relationship, school, and lack of motivation. Pt reports that she gave her wife a pass. She states that her wife is going on a girl's trip and pt and wife spoke about having an open marriage. This is something pt has been thinking about for a while.   Pt other stressor are starting back with school. Pt reports she got a 1.7 GPA last year due to her fighting with her wife. Pt reports that cannot happen this year and that is one of the reasons for the open marriage proposal. [name] reports stressor are also medications. Pt endorse symptoms for tangential thoughts, rapid speech, reckless behavior (rescuing multiple animal), along with isolation and lack of motivation. LCSW notes that pt has not been seen for medication mgmt. since Oct. Appt is now schedule for Jan 31st at [place] states, she will be getting school insurance this semester and hope that she will be able to use that as well for the school psychiatrist at [facility]. Pt is also considering taking on a counselor at [facility] as well for easier access due to being on campus.   Interventions/Plan: LCSW used CBT, supportive therapy, and motivational interviewing. LCSW spoke with pt about boundary setting and partialization in today's session for CBT. LCSW utilized language for praise encouragement, and empowerment for person centered therapy and supportive therapy. Pt to f/u with school insurance to find out what she has access too. Plan for pt to f/u in 4 weeks.    Plan: Return again in 4 weeks.     Kaitlin Horn, LCSW 03/02/2021

## 2021-03-07 ENCOUNTER — Telehealth (HOSPITAL_COMMUNITY): Payer: No Payment, Other | Admitting: Psychiatry

## 2021-03-23 ENCOUNTER — Ambulatory Visit (HOSPITAL_COMMUNITY): Payer: No Payment, Other | Admitting: Licensed Clinical Social Worker

## 2021-03-28 ENCOUNTER — Encounter (HOSPITAL_COMMUNITY): Payer: Self-pay | Admitting: Psychiatry

## 2021-03-28 ENCOUNTER — Telehealth (INDEPENDENT_AMBULATORY_CARE_PROVIDER_SITE_OTHER): Payer: No Payment, Other | Admitting: Psychiatry

## 2021-03-28 DIAGNOSIS — F3132 Bipolar disorder, current episode depressed, moderate: Secondary | ICD-10-CM | POA: Diagnosis not present

## 2021-03-28 DIAGNOSIS — F411 Generalized anxiety disorder: Secondary | ICD-10-CM | POA: Diagnosis not present

## 2021-03-28 MED ORDER — BUSPIRONE HCL 30 MG PO TABS: 15.0000 mg | ORAL_TABLET | Freq: Two times a day (BID) | ORAL | 2 refills | Status: DC

## 2021-03-28 MED ORDER — TRAZODONE HCL 50 MG PO TABS: 50.0000 mg | ORAL_TABLET | Freq: Every evening | ORAL | 2 refills | Status: DC | PRN

## 2021-03-28 MED ORDER — LAMOTRIGINE 25 MG PO TABS: 50.0000 mg | ORAL_TABLET | Freq: Two times a day (BID) | ORAL | 1 refills | Status: DC

## 2021-03-28 NOTE — Progress Notes (Signed)
[facility] NP Progress Notes  Patient Identification: Kaitlin Nelson MRN:  [id number] Date of Evaluation:  03/28/2021 Referral Source: [facility] Chief Complaint:   Chief Complaint   Anxiety; Depression; Follow-up   Depression, anxiety Visit Diagnosis: Bipolar d/o, depression, moderate  History of Present Illness:  26 y/o female presents today for follow up, history of bipolar d/o.  She is currently experiencing multiple stressors including separation.  8/10 depression with passive, intermittent suicidal ideations, utilizes her friend support group along with therapists.  No intent or plan.  High anxiety with panic attacks at times.  Sleep is fair with Trazodone, appetite is steady.   She does not feel the gabapentin is not working, discussed medications and decided to taper off the gabapentin along with starting Buspar and increasing her Lamictal.   Follow-up in 2 weeks to reevaluate symptoms, sooner if needed.  Past Psychiatric History: bipolar d/o, PTSD  Past Medical History:  Past Medical History:  Diagnosis Date   Acid reflux    Anxiety    Arthritis    Asthma    Bipolar disorder ([place])    Common migraine with intractable migraine 11/13/2018   Crohn disease ([place])    Depression    Scoliosis    Vitamin B12 deficiency 11/13/2018    Past Surgical History:  Procedure Laterality Date   CHOLECYSTECTOMY     THYROGLOSSAL DUCT CYST N/A 05/12/2018   Procedure: REMOVAL OF THYROID DUCT CYST;  Surgeon: Rozetta Nunnery, MD;  Location: [facility];  Service: ENT;  Laterality: N/A;   TUMOR REMOVAL     tumor removed from neck      Family Psychiatric History: none   Family History:  Family History  Problem Relation Age of Onset   Hypertension Mother    Bipolar disorder Mother     Social History:   Social History   Socioeconomic History   Marital status: Single    Spouse name: Not on file   Number of children: Not on file   Years of education: Not on file   Highest  education level: Not on file  Occupational History   Not on file  Tobacco Use   Smoking status: Former    Packs/day: 0.50    Years: 3.00    Pack years: 1.50    Types: Cigarettes    Quit date: 01/13/2018    Years since quitting: 3.2   Smokeless tobacco: Never  Vaping Use   Vaping Use: Never used  Substance and Sexual Activity   Alcohol use: Yes    Comment: rare   Drug use: Yes    Types: Marijuana    Comment: 1 joint before bedtime    Sexual activity: Yes    Partners: Female    Comment: Female Partner-1st intercourse 14 yo-More than 5 partners  Other Topics Concern   Not on file  Social History Narrative   Lives with Kaitlin Nelson.   Social Determinants of Health   Financial Resource Strain: Medium Risk   Difficulty of Paying Living Expenses: Somewhat hard  Food Insecurity: No Food Insecurity   Worried About Charity fundraiser in the Last Year: Never true   Ran Out of Food in the Last Year: Never true  Transportation Needs: Not on file  Physical Activity: Sufficiently Active   Days of Exercise per Week: 4 days   Minutes of Exercise per Session: 50 min  Stress: Stress Concern Present   Feeling of Stress : Very much  Social Connections: Not on  file    Additional Social History: Lives with her wife and pets, college for physics  Allergies:   Allergies  Allergen Reactions   Hydrocodone-Acetaminophen Nausea And Vomiting    severe headache with dizziness   Psyllium Nausea Only and Nausea And Vomiting   Hydrocodone Swelling, Anxiety and Rash    Throbbing in head     Metabolic Disorder Labs: No results found for: HGBA1C, MPG No results found for: PROLACTIN No results found for: CHOL, TRIG, HDL, CHOLHDL, VLDL, LDLCALC No results found for: TSH  Therapeutic Level Labs: No results found for: LITHIUM No results found for: CBMZ No results found for: VALPROATE  Current Medications: Current Outpatient Medications  Medication Sig Dispense Refill   busPIRone (BUSPAR)  30 MG tablet Take 0.5 tablets (15 mg total) by mouth 2 (two) times daily. 60 tablet 2   lamoTRIgine (LAMICTAL) 25 MG tablet Take 2 tablets (50 mg total) by mouth 2 (two) times daily. 120 tablet 1   albuterol (VENTOLIN HFA) 108 (90 Base) MCG/ACT inhaler Inhale into the lungs every 6 (six) hours as needed for wheezing or shortness of breath.      traZODone (DESYREL) 50 MG tablet Take 1 tablet (50 mg total) by mouth at bedtime as needed for sleep (take one tablet daily at bedtime as needed for sleep, may repeat once in an hour if not asleep). Take 50 mg at bedtime, May repeat once dose in 1 hour if not asleep 60 tablet 2   No current facility-administered medications for this visit.    Musculoskeletal: Strength & Muscle Tone: denies issues Gait & Station: denies issues Patient leans: denies issues  Psychiatric Specialty Exam: Review of Systems  Constitutional:  Positive for fatigue.  Psychiatric/Behavioral:  Positive for dysphoric mood. The patient is nervous/anxious.   All other systems reviewed and are negative.  There were no vitals taken for this visit.There is no height or weight on file to calculate BMI.  General Appearance: Telephone  Eye Contact: Telephone  Speech:  Normal Rate  Volume:  Normal  Mood: Anxiety and depression  Affect:  Telephone  Thought Process:  Coherent and Descriptions of Associations: Intact  Orientation:  Full (Time, Place, and Person)  Thought Content:  WDL and Logical  Suicidal Thoughts:  intermittent, passive suicidal ideations  Homicidal Thoughts:  No  Memory:  Immediate;   Good Recent;   Good Remote;   Good  Judgement:  Good  Insight:  Good  Psychomotor Activity:  Normal  Concentration:  Concentration: Good and Attention Span: Good  Recall:  Good  Fund of Knowledge:Good  Language: Good  Akathisia:  No  Handed:  Right  AIMS (if indicated):  not done  Assets:  Housing Leisure Time Physical Health Resilience Social Support  ADL's:  Intact   Cognition: WNL  Sleep:  Fair   Screenings: AUDIT    Health and safety inspector from 12/15/2019 in [facility]  Alcohol Use Disorder Identification Test Final Score (AUDIT) 4      GAD-7    Flowsheet Row Counselor from 01/26/2021 in [facility]  Total GAD-7 Score [place] Visit from 11/13/2018 in [facility]  Total Score (max 30 points ) 30      PHQ2-9    Flowsheet Row Counselor from 01/26/2021 in [facility] Counselor from 10/12/2020 in [facility] Counselor from 06/06/2020 in [facility] Video Visit from 12/29/2019 in [facility] Counselor from 12/15/2019 in [facility]  PHQ-2 Total Score 3 5 4 6 4   PHQ-9 Total Score 16 20 18 23 19       Flowsheet Row Counselor from 01/26/2021 in [facility] Counselor from 10/12/2020 in [facility] Counselor from 06/06/2020 in [facility] CATEGORY Moderate Risk Low Risk No Risk       Assessment and Plan:  Bipolar affective disorder, depressed, moderate: -Increase Lamictal at 75 mg daily to 50 mg BID for one week, then 75 mg in the am and 50 mg in the pm, and the third week 75 mg BID -Follow up in 2 weeks -Continue therapy   Anxiety: -Decrease gabapentin 200 mg TID PRN to 100 mg TID PRN, to BID in one week to daily to discontinue -Practice mindfulness activities -Started Buspar 15 mg BID  Insomnia: -Continue Trazodone 50 mg at bedtime PRN, may repeat once in an hour if need  Virtual Visit via Telephone Note  I connected with Midge Aver on 03/28/21 at  9:30 AM EST by telephone and verified that I am speaking with the correct person using two identifiers.  Location: Patient:  home Provider: home office   I discussed the limitations, risks, security and privacy concerns of performing an evaluation and management service by telephone and the availability of in person appointments. I also discussed with the patient that there may be a patient responsible charge related to this service. The patient expressed understanding and agreed to proceed.  Follow Up Instructions: Follow up in 2 weeks   I discussed the assessment and treatment plan with the patient. The patient was provided an opportunity to ask questions and all were answered. The patient agreed with the plan and demonstrated an understanding of the instructions.   The patient was advised to call back or seek an in-person evaluation if the symptoms worsen or if the condition fails to improve as anticipated.  I provided 20 minutes of non-face-to-face time during this encounter.   Waylan Boga, NP  Waylan Boga, NP 1/31/20239:50 AM

## 2021-04-11 ENCOUNTER — Encounter (HOSPITAL_COMMUNITY): Payer: Self-pay

## 2021-04-11 ENCOUNTER — Other Ambulatory Visit: Payer: Self-pay

## 2021-04-11 ENCOUNTER — Telehealth (HOSPITAL_COMMUNITY): Payer: BC Managed Care – PPO | Admitting: Psychiatry

## 2021-06-19 IMAGING — CT CT MAXILLOFACIAL W/O CM
3 series · 16 of 47 positions shown, 19 images · non-contrast
Comparison: Brain MRI 11/25/2018

CLINICAL DATA: Ass[place]

EXAM:
CT HEAD WITHOUT CONTRAST
CT MAXILLOFACIAL WITHOUT CONTRAST
TECHNIQUE: Multidetector CT imaging of the head and maxillofacial structures
were performed using the standard protocol without intravenous
contrast. Multiplanar CT image reconstructions of the maxillofacial
structures were also generated.

[Series 3: max soft · axial · 0.35mm/px · z∈[-283,-139]mm · 10 of 84 slices shown, 13 images]
[im 6/84  brain]
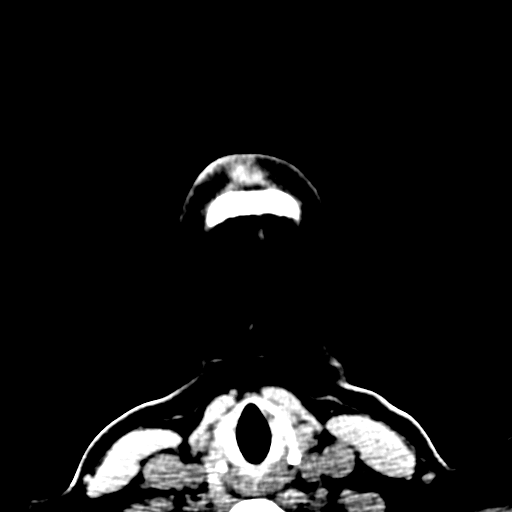
[im 6/84  bone]
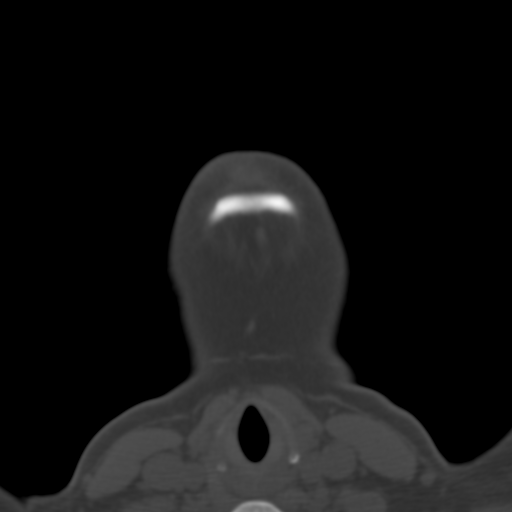
[im 15/84  bone]
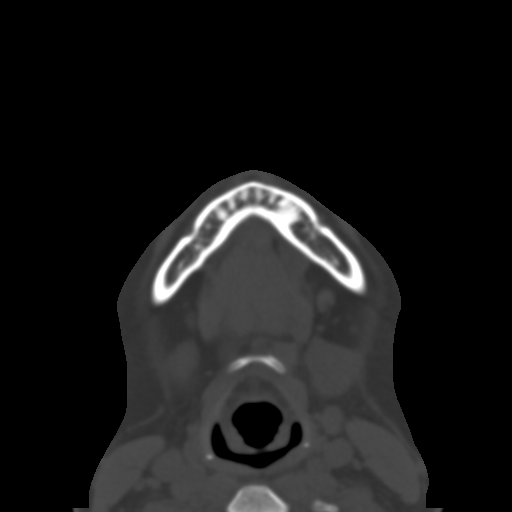
[im 23/84  bone]
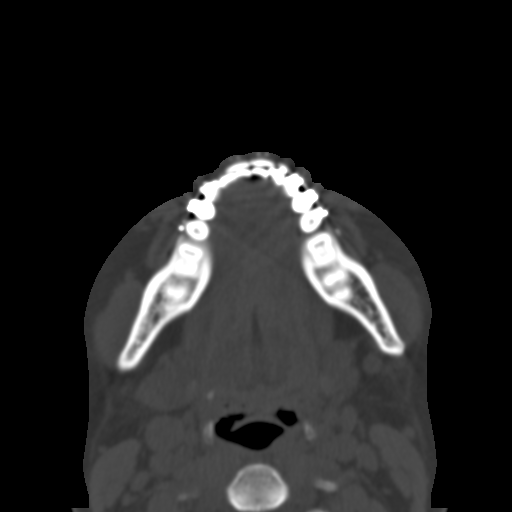
[im 29/84  bone]
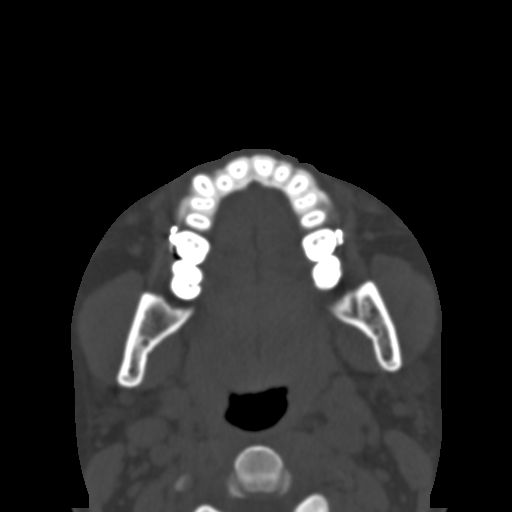
[im 38/84  brain]
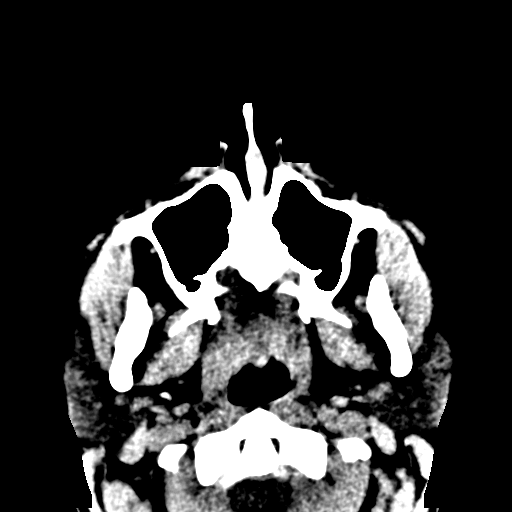
[im 38/84  bone]
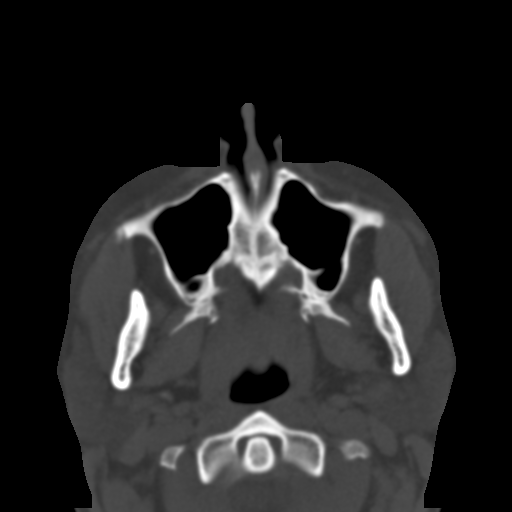
[im 46/84  bone]
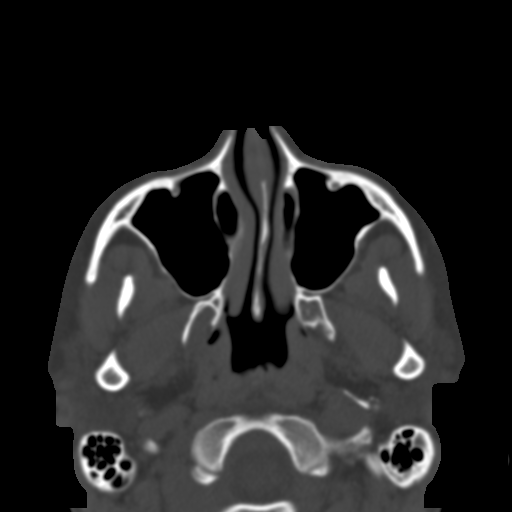
[im 55/84  bone]
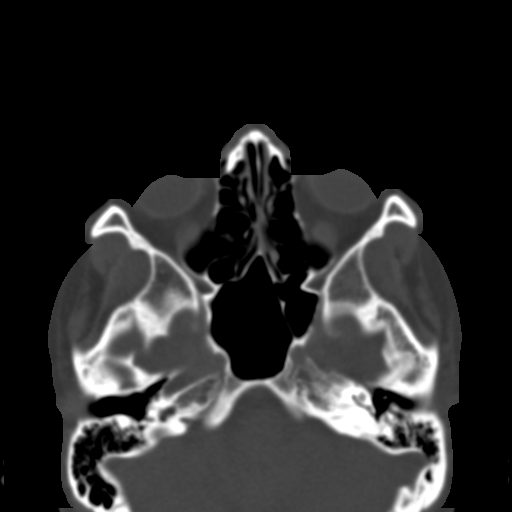
[im 63/84  bone]
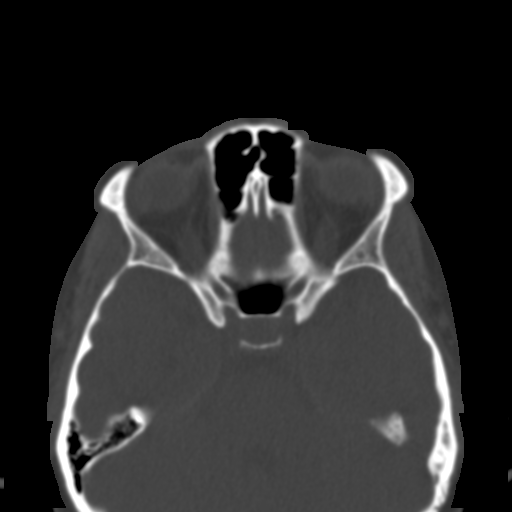
[im 69/84  brain]
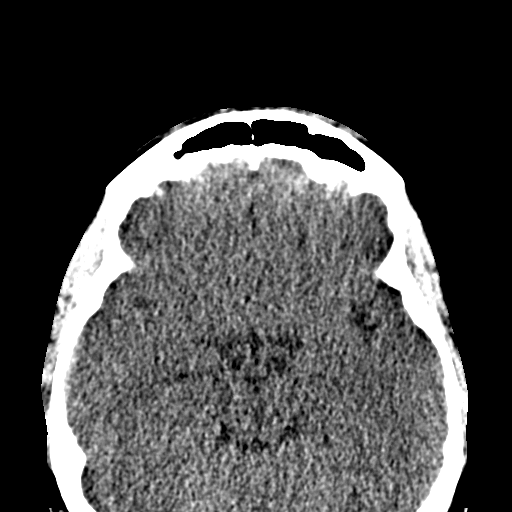
[im 69/84  bone]
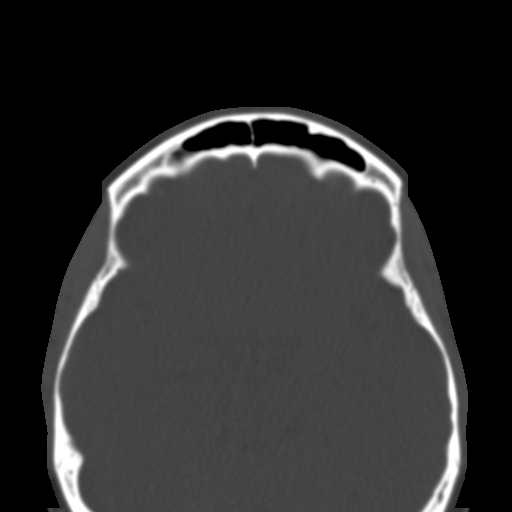
[im 78/84  bone]
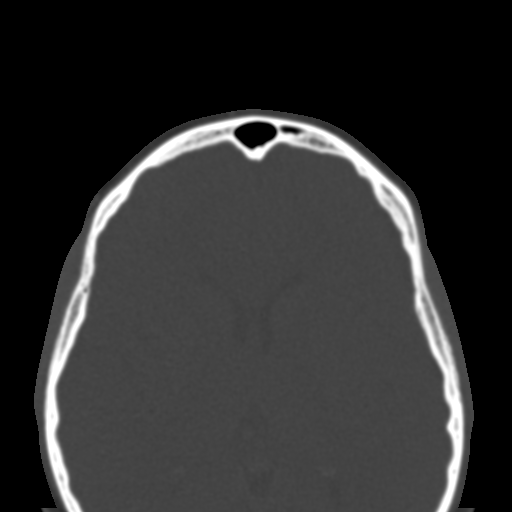

[Series 7: coronal soft · coronal · 0.35mm/px · 3 of 75 slices shown]
[im 25/75  bone]
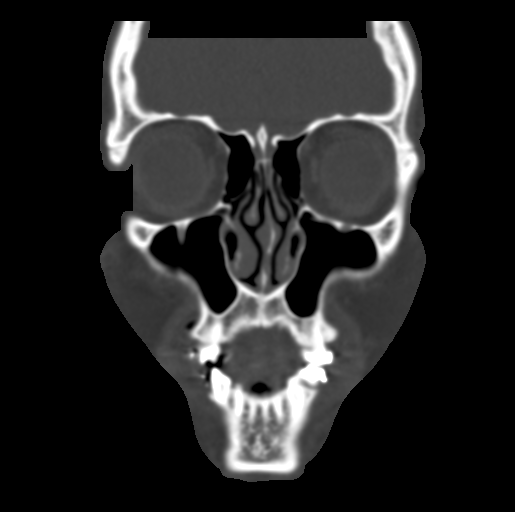
[im 33/75  bone]
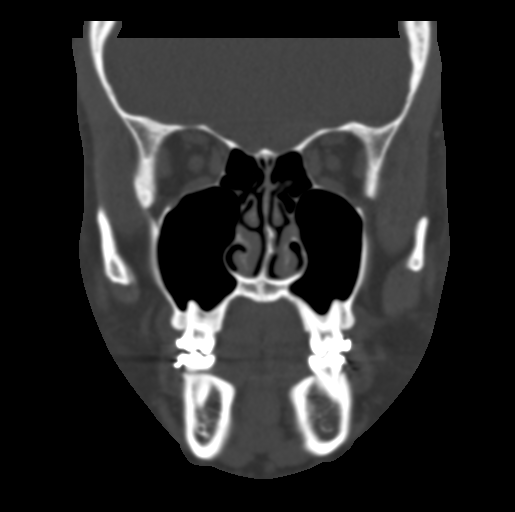
[im 42/75  bone]
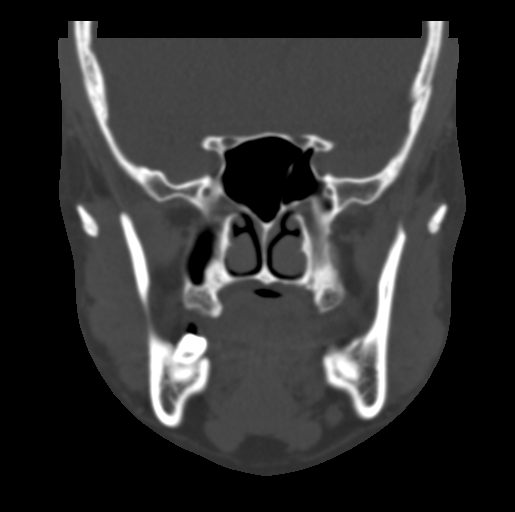

[Series 8: sagittal soft · sagittal · 0.31mm/px · 3 of 87 slices shown]
[im 29/87  bone]
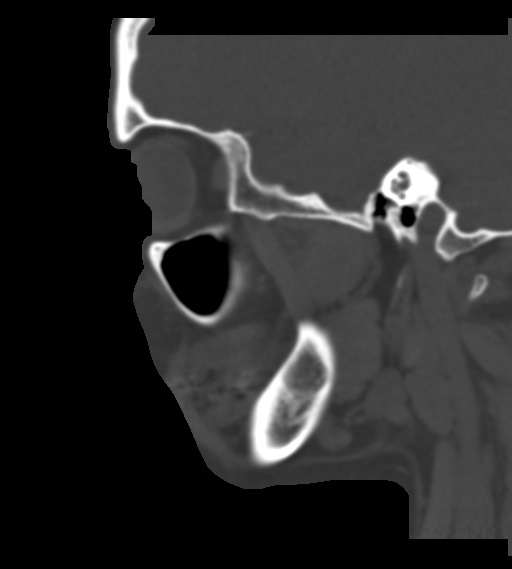
[im 44/87  bone]
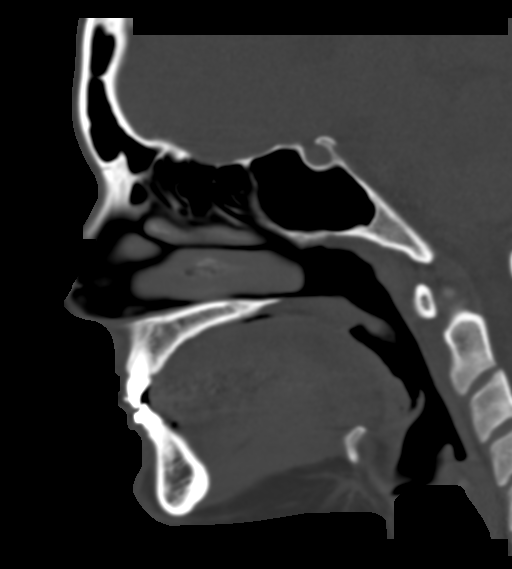
[im 58/87  bone]
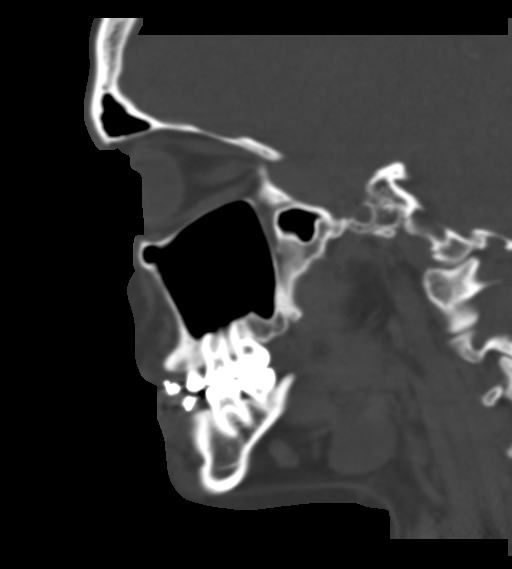

[16 of 47 positions shown; findings below may reference images not displayed]

FINDINGS: CT HEAD FINDINGS

Brain: There is no mass, hemorrhage or extra-axial collection. The
size and configuration of the ventricles and extra-axial CSF spaces
are normal. The brain parenchyma is normal, without evidence of
acute or chronic infarction.

Vascular: No hyperdense vessel or unexpected vascular calcification.

Skull: The visualized skull base, calvarium and extracranial soft
tissues are normal.

CT MAXILLOFACIAL FINDINGS

Osseous:

--Complex facial fracture types: No [place], zygomaticomaxillary
complex or nasoorbitoethmoidal fracture.

--Simple fracture types: None.

--Mandible, hard palate and teeth: No acute abnormality.

Orbits: The globes are intact. Normal appearance of the intra- and
extraconal fat. Symmetric extraocular muscles.

Sinuses: No fluid levels or advanced mucosal thickening.

Soft tissues: Normal visualized extracranial soft tissues.
IMPRESSION: 1. Normal head CT.
2. No facial or skull fracture.

## 2021-06-23 ENCOUNTER — Ambulatory Visit (HOSPITAL_COMMUNITY)
Admission: EM | Admit: 2021-06-23 | Discharge: 2021-06-23 | Disposition: A | Discharge status: Home / Self Care | Payer: BC Managed Care – PPO

## 2021-06-23 DIAGNOSIS — F3132 Bipolar disorder, current episode depressed, moderate: Secondary | ICD-10-CM

## 2021-06-23 MED ORDER — LAMOTRIGINE 100 MG PO TABS: 100.0000 mg | ORAL_TABLET | Freq: Two times a day (BID) | ORAL | 0 refills | Status: DC

## 2021-06-23 MED ORDER — ESCITALOPRAM OXALATE 10 MG PO TABS: 10.0000 mg | ORAL_TABLET | Freq: Every day | ORAL | 0 refills | Status: DC

## 2021-06-23 NOTE — Discharge Instructions (Signed)
?  Discharge recommendations:  ?Patient is to take medications as prescribed. ?Please see information for follow-up appointment with psychiatry and therapy. ?Please follow up with your primary care provider for all medical related needs.  ? ?Therapy: We recommend that patient participate in individual therapy to address mental health concerns. ? ?Medications: The parent/guardian is to contact a medical professional and/or outpatient provider to address any new side effects that develop. Parent/guardian should update outpatient providers of any new medications and/or medication changes.  ? ?Atypical antipsychotics: If you are prescribed an atypical antipsychotic, it is recommended that your height, weight, BMI, blood pressure, fasting lipid panel, and fasting blood sugar be monitored by your outpatient providers. ? ?Safety:  ?The patient should abstain from use of illicit substances/drugs and abuse of any medications. ?If symptoms worsen or do not continue to improve or if the patient becomes actively suicidal or homicidal then it is recommended that the patient return to the closest hospital emergency department, the [facility], or call 911 for further evaluation and treatment. ?National Suicide Prevention Lifeline 1-800-SUICIDE or (364)232-6705. ? ?About 988 ?988 offers 24/7 access to trained crisis counselors who can help people experiencing mental health-related distress. People can call or text 988 or chat 988lifeline.org for themselves or if they are worried about a loved one who may need crisis support. ? ?

## 2021-06-23 NOTE — ED Provider Notes (Addendum)
Behavioral Health Urgent Care Medical Screening Exam ? ?Patient Name: Kaitlin Nelson ?MRN: [id number] ?Date of Evaluation: 06/23/21 ?Chief Complaint:   ?Diagnosis:  ?Final diagnoses:  ?Bipolar affective disorder, currently depressed, moderate ([place])  ? ? ?History of Present illness: Kaitlin Nelson is a 26 y.o. female with a psychiatric history of bipolar affective disorder, PTSD, and anxiety.  Patient presented voluntarily to [facility] requesting medication refill. ? ?Patient reported that she currently takes BuSpar 15 mg twice daily, lamictal 100 mg twice daily, gabapentin 300 mg twice daily.  She reports that she is in the process of establishing care with a new provider at [facility].  She says that she has an upcoming appointment on Wednesday 06/28/21.  She says she recently ran out of her medication and has been experiencing low mood. She shares that she is in the process of getting a divorces. She says she was feeling depressed and had suicidal ideation 2 days ago. She says suicidal thought has worsen since starting Buspar 2 months ago and would like to discontinue taking Buspar. She says she has a hx of cutting; she says she stopped cutting in 2019 but engaged in cutting 2 days ago. Patient is noted with 5-6 superficial cuts to right upper thigh. She denies current suicidal ideation, self harming thoughts; she says she has no intent or plan to harm herself. She says her roommates locked up all sharps and medication and are home with her.  she denies homicidal ideation, hallucination, and paranoia. She admits to using 0.5gram of marijuana daily. She denies all other substance abuse.  ? ?Patient consented to this writer contacting her roommate Martinique patrolino [phone] for collateral information. ?Per [place] patient has been depressed over the past few days.  She reports that patient engaged in self harming by cutting 2 days ago and also wrote suicidal note. [place] reports that patient's suicidal note stated patient  will commit suicide in the future once she has accomplished her goals in life. She confirmed that all sharps are locked up and that she and another roommate manages patient's medication. She denies that patient is in imminent danger. ? ?Per chart review, during last visit with Waylan Boga, NP on 03/28/21 patient was instructed to wean self off Gabapentin.  ? ?-Patient instructed to wean off Gabapentin again: patient to follow up with new provider during visit on 06/28/21 for symptom management. For now,  patient is to take Gabapentin 277m BID X 5 day, then 1022mBID X5 days, then 100 mg/day x 5 days then discontinue.  ?-Buspar discontinues ? ?depression and anxiety ?-Start Lexapro 1047may X 1 month . follow up with outpatient provider for refill ? ?Bipolar affective D/O ?-Refill for Lamictal 100m24mD X 1 month, follow up with outpatient provider for refill.  ?  ? ?Psychiatric Specialty Exam ? ?Presentation  ?General Appearance:No data recorded ?Eye Contact:No data recorded ?Speech:No data recorded ?Speech Volume:No data recorded ?Handedness:No data recorded ? ?Mood and Affect  ?Mood:No data recorded ?Affect:No data recorded ? ?Thought Process  ?Thought Processes:No data recorded ?Descriptions of Associations:No data recorded ?Orientation:No data recorded ?Thought Content:No data recorded   Hallucinations:No data recorded ?Ideas of Reference:No data recorded ?Suicidal Thoughts:No data recorded ?Homicidal Thoughts:No data recorded ? ?Sensorium  ?Memory:No data recorded ?Judgment:No data recorded ?Insight:No data recorded ? ?Executive Functions  ?Concentration:No data recorded ?Attention Span:No data recorded ?Recall:No data recorded ?Fund of KnowGordonvilleorded ?Language:No data recorded ? ?Psychomotor Activity  ?Psychomotor Activity:No data recorded ? ?Assets  ?Assets:No data recorded ? ?Sleep  ?  Sleep:No data recorded ?Number of hours: No data recorded ? ?No data recorded ? ?Physical Exam: ?Physical  Exam ?Vitals and nursing note reviewed.  ?Constitutional:   ?   General: She is not in acute distress. ?   Appearance: She is well-developed.  ?HENT:  ?   Head: Normocephalic and atraumatic.  ?Eyes:  ?   Conjunctiva/sclera: Conjunctivae normal.  ?Cardiovascular:  ?   Rate and Rhythm: Normal rate.  ?Pulmonary:  ?   Effort: Pulmonary effort is normal. No respiratory distress.  ?   Breath sounds: Normal breath sounds.  ?Abdominal:  ?   Palpations: Abdomen is soft.  ?   Tenderness: There is no abdominal tenderness.  ?Musculoskeletal:     ?   General: No swelling.  ?   Cervical back: Neck supple.  ?Skin: ?   General: Skin is warm and dry.  ?   Capillary Refill: Capillary refill takes less than 2 seconds.  ?Neurological:  ?   Mental Status: She is alert and oriented to person, place, and time.  ?Psychiatric:     ?   Attention and Perception: Attention and perception normal.     ?   Mood and Affect: Mood is depressed.     ?   Speech: Speech normal.     ?   Behavior: Behavior normal. Behavior is cooperative.     ?   Thought Content: Thought content normal.     ?   Cognition and Memory: Cognition normal.  ? ?Review of Systems  ?Constitutional: Negative.   ?HENT: Negative.    ?Eyes: Negative.   ?Respiratory: Negative.    ?Cardiovascular: Negative.   ?Gastrointestinal: Negative.   ?Genitourinary: Negative.   ?Musculoskeletal: Negative.   ?Skin: Negative.   ?Neurological: Negative.   ?Endo/Heme/Allergies: Negative.   ?Psychiatric/Behavioral:  Positive for depression and substance abuse.   ?Blood pressure 116/65, pulse 79, temperature 98.1 ?F (36.7 ?C), temperature source Oral, resp. rate 18, SpO2 98 %. There is no height or weight on file to calculate BMI. ? ?Musculoskeletal: ?Strength & Muscle Tone: within normal limits ?Gait & Station: normal ?Patient leans: Right ? ? ?[facility] MSE Discharge Disposition for Follow up and Recommendations: ?Based on my evaluation the patient does not appear to have an emergency medical condition  and can be discharged with resources and follow up care in outpatient services for Medication Management and Individual Therapy ? ?Per chart review, during last visit with Waylan Boga, NP on 03/28/21 patient was instructed to wean self off Gabapentin.  ? ?-Patient instructed to wean off Gabapentin again: patient to follow up with new provider during visit on 06/28/21 for symptom management. For now,  patient is to take Gabapentin 275m BID X 5 day, then 1065mBID X5 days, then 100 mg/day x 5 days then discontinue.  ?-Buspar discontinues ? ?depression and anxiety ?-Start Lexapro 1091may X 1 month . follow up with outpatient provider for refill ? ?Bipolar affective D/O ?-Refill for Lamictal 100m33mD X 1 month, follow up with outpatient provider for refill.  ?  ?Vallarie Fei Ophelia Shoulder ?06/23/2021, 10:17 PM ? ?
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
## 2021-06-25 ENCOUNTER — Other Ambulatory Visit (HOSPITAL_COMMUNITY): Payer: Self-pay | Admitting: Psychiatry

## 2021-07-14 ENCOUNTER — Other Ambulatory Visit (HOSPITAL_COMMUNITY): Payer: Self-pay | Admitting: Psychiatry

## 2021-07-21 ENCOUNTER — Other Ambulatory Visit (HOSPITAL_COMMUNITY): Payer: Self-pay | Admitting: Psychiatry

## 2021-07-21 MED ORDER — LAMOTRIGINE 100 MG PO TABS: 100.0000 mg | ORAL_TABLET | Freq: Two times a day (BID) | ORAL | 0 refills | Status: DC

## 2021-07-21 MED ORDER — ESCITALOPRAM OXALATE 10 MG PO TABS: 10.0000 mg | ORAL_TABLET | Freq: Every day | ORAL | 0 refills | Status: DC

## 2021-07-21 NOTE — Progress Notes (Signed)
Client's Lamictal and Lexapro reordered.  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  Waylan Boga, PMHNP

## 2021-08-13 ENCOUNTER — Emergency Department (HOSPITAL_COMMUNITY)
Admission: EM | Admit: 2021-08-13 | Discharge: 2021-08-14 | Disposition: A | Discharge status: Home / Self Care | Payer: BC Managed Care – PPO | Attending: Emergency Medicine | Admitting: Emergency Medicine

## 2021-08-13 ENCOUNTER — Encounter (HOSPITAL_COMMUNITY): Payer: Self-pay

## 2021-08-13 DIAGNOSIS — R55 Syncope and collapse: Secondary | ICD-10-CM | POA: Insufficient documentation

## 2021-08-13 DIAGNOSIS — R42 Dizziness and giddiness: Secondary | ICD-10-CM | POA: Diagnosis not present

## 2021-08-13 LAB — I-STAT BETA HCG BLOOD, ED (MC, WL, AP ONLY): I-stat hCG, quantitative: <5 m[IU]/mL (ref <5)

## 2021-08-13 LAB — CBG MONITORING, ED: Glucose-Capillary: 92 mg/dL (ref 70–99)

## 2021-08-13 NOTE — ED Triage Notes (Signed)
Pt reports that she had a syncopal episode earlier with vomiting. Pt reports that she was orthostatic with EMS and given fluids. Reports recent medication change

## 2021-08-14 LAB — BASIC METABOLIC PANEL
Anion gap: 8 (ref 5–15)
BUN: 13 mg/dL (ref 6–20)
CO2: 24 mmol/L (ref 22–32)
Calcium: 8.9 mg/dL (ref 8.9–10.3)
Chloride: 108 mmol/L (ref 98–111)
Creatinine, Ser: 0.92 mg/dL (ref 0.44–1.00)
GFR, Estimated: >60 mL/min (ref >60)
Glucose, Bld: 99 mg/dL (ref 70–99)
Potassium: 3.8 mmol/L (ref 3.5–5.1)
Sodium: 140 mmol/L (ref 135–145)

## 2021-08-14 LAB — URINALYSIS, ROUTINE W REFLEX MICROSCOPIC
Bilirubin Urine: NEGATIVE
Glucose, UA: NEGATIVE mg/dL
Hgb urine dipstick: NEGATIVE
Ketones, ur: NEGATIVE mg/dL
Leukocytes,Ua: NEGATIVE
Nitrite: NEGATIVE
Protein, ur: NEGATIVE mg/dL
Specific Gravity, Urine: 1.012 (ref 1.005–1.030)
pH: 8 (ref 5.0–8.0)

## 2021-08-14 LAB — CBC
HCT: 34.7 % — ABNORMAL LOW (ref 36.0–46.0)
Hemoglobin: 11.2 g/dL — ABNORMAL LOW (ref 12.0–15.0)
MCH: 30.5 pg (ref 26.0–34.0)
MCHC: 32.3 g/dL (ref 30.0–36.0)
MCV: 94.6 fL (ref 80.0–100.0)
Platelets: 249 10*3/uL (ref 150–400)
RBC: 3.67 MIL/uL — ABNORMAL LOW (ref 3.87–5.11)
RDW: 13.2 % (ref 11.5–15.5)
WBC: 9.3 10*3/uL (ref 4.0–10.5)
nRBC: 0 % (ref 0.0–0.2)

## 2021-08-14 LAB — TROPONIN I (HIGH SENSITIVITY): Troponin I (High Sensitivity): <2 ng/L (ref <18)

## 2021-08-14 LAB — D-DIMER, QUANTITATIVE: D-Dimer, Quant: <0.27 ug/mL-FEU (ref 0.00–0.50)

## 2021-08-14 NOTE — ED Provider Notes (Signed)
[facility] EMERGENCY DEPARTMENT Provider Note   CSN: [id number] Arrival date & time: 08/13/21  2252     History {Add pertinent medical, surgical, social history, OB history to HPI:1} Chief Complaint  Patient presents with   Near Syncope    Kaitlin Nelson is a 26 y.o. female.   Near Syncope       Home Medications Prior to Admission medications   Medication Sig Start Date End Date Taking? Authorizing Provider  albuterol (VENTOLIN HFA) 108 (90 Base) MCG/ACT inhaler Inhale into the lungs every 6 (six) hours as needed for wheezing or shortness of breath.     Joline Salt, RN  escitalopram (LEXAPRO) 10 MG tablet Take 1 tablet (10 mg total) by mouth daily. 07/21/21 07/21/22  [name], NP  lamoTRIgine (LAMICTAL) 100 MG tablet Take 1 tablet (100 mg total) by mouth 2 (two) times daily. 07/21/21   [name], NP  lamoTRIgine (LAMICTAL) 25 MG tablet TAKE 2 TABLETS BY MOUTH TWICE A DAY 07/21/21   [name], NP  traZODone (DESYREL) 50 MG tablet Take 1 tablet (50 mg total) by mouth at bedtime as needed for sleep (take one tablet daily at bedtime as needed for sleep, may repeat once in an hour if not asleep). Take 50 mg at bedtime, May repeat once dose in 1 hour if not asleep 03/28/21 05/27/21  [name], NP      Allergies    Hydrocodone-acetaminophen, Psyllium, and Hydrocodone    Review of Systems   Review of Systems  Cardiovascular:  Positive for near-syncope.    Physical Exam Updated Vital Signs BP 103/69 (BP Location: Right Arm)   Pulse 75   Temp 98.4 F (36.9 C) (Oral)   Resp 18   SpO2 100%  Physical Exam  ED Results / Procedures / Treatments   Labs (all labs ordered are listed, but only abnormal results are displayed) Labs Reviewed  CBC - Abnormal; Notable for the following components:      Result Value   RBC 3.67 (*)    Hemoglobin 11.2 (*)    HCT 34.7 (*)    All other components within normal limits  BASIC METABOLIC PANEL   URINALYSIS, ROUTINE W REFLEX MICROSCOPIC  CBG MONITORING, ED  I-STAT BETA HCG BLOOD, ED ([facility], [facility], [facility] ONLY)    EKG EKG Interpretation  Date/Time:  Sunday August 13 2021 23:30:27 EDT Ventricular Rate:  67 PR Interval:  166 QRS Duration: 84 QT Interval:  390 QTC Calculation: 412 R Axis:   81 Text Interpretation: Normal sinus rhythm Normal ECG When compared with ECG of 11-Nov-2017 18:05, No significant change was found Confirmed by Delora Fuel ([id number]) on 08/14/2021 2:32:02 AM  Radiology No results found.  Procedures Procedures  {Document cardiac monitor, telemetry assessment procedure when appropriate:1}  Medications Ordered in ED Medications - No data to display  ED Course/ Medical Decision Making/ A&P                           Medical Decision Making Amount and/or Complexity of Data Reviewed Labs: ordered.   ***  {Document critical care time when appropriate:1} {Document review of labs and clinical decision tools ie heart score, Chads2Vasc2 etc:1}  {Document your independent review of radiology images, and any outside records:1} {Document your discussion with family members, caretakers, and with consultants:1} {Document social determinants of health affecting pt's care:1} {Document your decision making why or why not admission,  treatments were needed:1} Final Clinical Impression(s) / ED Diagnoses Final diagnoses:  None    Rx / DC Orders ED Discharge Orders     None

## 2021-08-14 NOTE — Discharge Instructions (Addendum)
Please schedule an appointment to follow-up with cardiology in clinic to discuss your symptoms. Your episode yesterday sounds related to dehydration and orthostatic hypotension. Your episode earlier in the week sounds related to vasovagal syncope. Your EKG and lab workup was unremarkable.

## 2021-09-08 ENCOUNTER — Encounter: Payer: Self-pay | Admitting: *Deleted

## 2021-10-24 ENCOUNTER — Ambulatory Visit: Payer: BC Managed Care – PPO | Attending: Interventional Cardiology | Admitting: Interventional Cardiology

## 2021-11-23 ENCOUNTER — Ambulatory Visit
Admission: EM | Admit: 2021-11-23 | Discharge: 2021-11-23 | Disposition: A | Discharge status: Home / Self Care | Payer: BC Managed Care – PPO | Attending: Internal Medicine | Admitting: Internal Medicine

## 2021-11-23 DIAGNOSIS — T7840XA Allergy, unspecified, initial encounter: Secondary | ICD-10-CM | POA: Diagnosis not present

## 2021-11-23 DIAGNOSIS — M7989 Other specified soft tissue disorders: Secondary | ICD-10-CM | POA: Diagnosis not present

## 2021-11-23 MED ORDER — FAMOTIDINE 20 MG PO TABS
20.0000 mg | ORAL_TABLET | Freq: Two times a day (BID) | ORAL | 0 refills | Status: DC
Start: 2021-11-23 — End: 2022-07-02

## 2021-11-23 MED ORDER — DEXAMETHASONE SODIUM PHOSPHATE 10 MG/ML IJ SOLN
10.0000 mg | Freq: Once | INTRAMUSCULAR | Status: AC
  Administered 2021-11-23: 10 mg via INTRAMUSCULAR

## 2021-11-23 NOTE — ED Triage Notes (Signed)
Pt states had a itchy red spot to lt upper arm last night, now has redness, head, sore, and itchy. Denies taking any meds.

## 2021-11-23 NOTE — ED Provider Notes (Signed)
[facility]    CSN: [id number] Arrival date & time: 11/23/21  1809      History   Chief Complaint Chief Complaint  Patient presents with   Insect Bite    HPI Kaitlin Nelson is a 26 y.o. female.   Patient presents with swelling, redness, itchiness to left upper arm that started last night.  Patient reports that she was sitting still when she noticed discomfort in her arm.  Denies noticing any insect or spider bite her.  Denies any recent changes to lotions, soaps, detergents, foods, etc.  Patient has not used any medications to alleviate symptoms.  Denies any associated fever.  Denies feelings of throat closing or shortness of breath.     Past Medical History:  Diagnosis Date   Acid reflux    Anxiety    Arthritis    Asthma    Bipolar disorder ([place])    Common migraine with intractable migraine 11/13/2018   Crohn disease ([place])    Depression    Scoliosis    Syncope and collapse    Vitamin B12 deficiency 11/13/2018    Patient Active Problem List   Diagnosis Date Noted   Generalized anxiety disorder 03/28/2021   Bipolar affective disorder, depressed, moderate ([place]) 12/12/2020   PTSD (post-traumatic stress disorder) 12/15/2019   Common migraine with intractable migraine 11/13/2018   Vitamin B12 deficiency 11/13/2018    Past Surgical History:  Procedure Laterality Date   CHOLECYSTECTOMY     THYROGLOSSAL DUCT CYST N/A 05/12/2018   Procedure: REMOVAL OF THYROID DUCT CYST;  Surgeon: Rozetta Nunnery, MD;  Location: [facility];  Service: ENT;  Laterality: N/A;   TUMOR REMOVAL     tumor removed from neck      OB History     Gravida  0   Para  0   Term  0   Preterm  0   AB  0   Living  0      SAB  0   IAB  0   Ectopic  0   Multiple  0   Live Births               Home Medications    Prior to Admission medications   Medication Sig Start Date End Date Taking? Authorizing Provider  famotidine (PEPCID) 20 MG  tablet Take 1 tablet (20 mg total) by mouth 2 (two) times daily. 11/23/21  Yes , Michele Rockers, FNP  albuterol (VENTOLIN HFA) 108 (90 Base) MCG/ACT inhaler Inhale into the lungs every 6 (six) hours as needed for wheezing or shortness of breath.     Joline Salt, RN  escitalopram (LEXAPRO) 10 MG tablet Take 1 tablet (10 mg total) by mouth daily. 07/21/21 07/21/22  [name], NP  lamoTRIgine (LAMICTAL) 100 MG tablet Take 1 tablet (100 mg total) by mouth 2 (two) times daily. 07/21/21   [name], NP  lamoTRIgine (LAMICTAL) 25 MG tablet TAKE 2 TABLETS BY MOUTH TWICE A DAY 07/21/21   [name], NP  traZODone (DESYREL) 50 MG tablet Take 1 tablet (50 mg total) by mouth at bedtime as needed for sleep (take one tablet daily at bedtime as needed for sleep, may repeat once in an hour if not asleep). Take 50 mg at bedtime, May repeat once dose in 1 hour if not asleep 03/28/21 05/27/21  [name], NP    Family History Family History  Problem Relation Age of Onset  Hypertension Mother    Bipolar disorder Mother     Social History Social History   Tobacco Use   Smoking status: Former    Packs/day: 0.50    Years: 3.00    Total pack years: 1.50    Types: Cigarettes    Quit date: 01/13/2018    Years since quitting: 3.8   Smokeless tobacco: Never  Vaping Use   Vaping Use: Never used  Substance Use Topics   Alcohol use: Yes    Comment: rare   Drug use: Yes    Types: Marijuana    Comment: 1 joint before bedtime      Allergies   Hydrocodone-acetaminophen, Psyllium, and Hydrocodone   Review of Systems Review of Systems Per HPI  Physical Exam Triage Vital Signs ED Triage Vitals  Enc Vitals Group     BP 11/23/21 1913 99/66     Pulse Rate 11/23/21 1913 71     Resp 11/23/21 1913 18     Temp 11/23/21 1913 98 F (36.7 C)     Temp Source 11/23/21 1913 Oral     SpO2 11/23/21 1913 98 %     Weight --      Height --      Head Circumference --      Peak Flow --      Pain  Score 11/23/21 1914 2     Pain Loc --      Pain Edu? --      Excl. in [place]? --    No data found.  Updated Vital Signs BP 99/66 (BP Location: Left Arm)   Pulse 71   Temp 98 F (36.7 C) (Oral)   Resp 18   LMP 11/19/2021   SpO2 98%   Visual Acuity Right Eye Distance:   Left Eye Distance:   Bilateral Distance:    Right Eye Near:   Left Eye Near:    Bilateral Near:     Physical Exam Constitutional:      General: She is not in acute distress.    Appearance: Normal appearance. She is not toxic-appearing or diaphoretic.  HENT:     Head: Normocephalic and atraumatic.  Eyes:     Extraocular Movements: Extraocular movements intact.     Conjunctiva/sclera: Conjunctivae normal.  Pulmonary:     Effort: Pulmonary effort is normal.  Skin:    Comments: Patient has approximately 3 inch x 2 to 3 inch area of redness and mild swelling present to left anterior forearm.  Also has approximately 2.5 inch in diameter circular erythematous area that is flat present to left upper arm.  No obvious drainage from either area.  Patient is neurovascularly intact and has full range of motion.  Neurological:     General: No focal deficit present.     Mental Status: She is alert and oriented to person, place, and time. Mental status is at baseline.  Psychiatric:        Mood and Affect: Mood normal.        Behavior: Behavior normal.        Thought Content: Thought content normal.        Judgment: Judgment normal.      UC Treatments / Results  Labs (all labs ordered are listed, but only abnormal results are displayed) Labs Reviewed - No data to display  EKG   Radiology No results found.  Procedures Procedures (including critical care time)  Medications Ordered in UC Medications  dexamethasone (DECADRON) injection 10 mg (10  mg Intramuscular Given 11/23/21 1932)    Initial Impression / Assessment and Plan / UC Course  I have reviewed the triage vital signs and the nursing  notes.  Pertinent labs & imaging results that were available during my care of the patient were reviewed by me and considered in my medical decision making (see chart for details).     It appears the patient is having a local allergic reaction to possible insect or spider bite.  No signs of bacterial infection on exam.  Given associated swelling and itchiness, I do think patient would benefit from IM steroid so that was administered today in urgent care.  Pepcid also prescribed for patient to start taking.  Will avoid continuous antihistamines given the patient's daily medications and they may interact.  Advised cool compresses for comfort as well.  Patient advised to follow-up if symptoms persist or worsen.  Patient verbalized understanding and was agreeable with plan. Final Clinical Impressions(s) / UC Diagnoses   Final diagnoses:  Left arm swelling  Allergic reaction, initial encounter     Discharge Instructions      It appears that you may have be having an allergic reaction to some sort of insect bite.  I have prescribed you Pepcid and you were given a steroid injection today in urgent care.  Please follow-up if any symptoms persist or worsen.    ED Prescriptions     Medication Sig Dispense Auth. Provider   famotidine (PEPCID) 20 MG tablet Take 1 tablet (20 mg total) by mouth 2 (two) times daily. 30 tablet [place], Michele Rockers, [place]      PDMP not reviewed this encounter.   Teodora Medici, [place] 11/23/21 2023

## 2021-11-23 NOTE — Discharge Instructions (Addendum)
It appears that you may have be having an allergic reaction to some sort of insect bite.  I have prescribed you Pepcid and you were given a steroid injection today in urgent care.  Please follow-up if any symptoms persist or worsen.

## 2022-03-07 ENCOUNTER — Emergency Department (HOSPITAL_COMMUNITY): Payer: BC Managed Care – PPO

## 2022-03-07 ENCOUNTER — Emergency Department (HOSPITAL_COMMUNITY)
Admission: EM | Admit: 2022-03-07 | Discharge: 2022-03-08 | Disposition: A | Discharge status: Home / Self Care | Payer: BC Managed Care – PPO | Attending: Emergency Medicine | Admitting: Emergency Medicine

## 2022-03-07 ENCOUNTER — Other Ambulatory Visit: Payer: Self-pay

## 2022-03-07 ENCOUNTER — Encounter (HOSPITAL_COMMUNITY): Payer: Self-pay

## 2022-03-07 DIAGNOSIS — R519 Headache, unspecified: Secondary | ICD-10-CM | POA: Diagnosis not present

## 2022-03-07 DIAGNOSIS — H4710 Unspecified papilledema: Secondary | ICD-10-CM | POA: Diagnosis present

## 2022-03-07 DIAGNOSIS — G932 Benign intracranial hypertension: Secondary | ICD-10-CM

## 2022-03-07 LAB — CBC
HCT: 40.2 % (ref 36.0–46.0)
Hemoglobin: 13 g/dL (ref 12.0–15.0)
MCH: 29.7 pg (ref 26.0–34.0)
MCHC: 32.3 g/dL (ref 30.0–36.0)
MCV: 92 fL (ref 80.0–100.0)
Platelets: 298 10*3/uL (ref 150–400)
RBC: 4.37 MIL/uL (ref 3.87–5.11)
RDW: 12.6 % (ref 11.5–15.5)
WBC: 8.7 10*3/uL (ref 4.0–10.5)
nRBC: 0 % (ref 0.0–0.2)

## 2022-03-07 LAB — COMPREHENSIVE METABOLIC PANEL
ALT: 13 U/L (ref 0–44)
AST: 18 U/L (ref 15–41)
Albumin: 3.7 g/dL (ref 3.5–5.0)
Alkaline Phosphatase: 56 U/L (ref 38–126)
Anion gap: 8 (ref 5–15)
BUN: 13 mg/dL (ref 6–20)
CO2: 22 mmol/L (ref 22–32)
Calcium: 8.9 mg/dL (ref 8.9–10.3)
Chloride: 107 mmol/L (ref 98–111)
Creatinine, Ser: 0.86 mg/dL (ref 0.44–1.00)
GFR, Estimated: >60 mL/min (ref >60)
Glucose, Bld: 86 mg/dL (ref 70–99)
Potassium: 3.9 mmol/L (ref 3.5–5.1)
Sodium: 137 mmol/L (ref 135–145)
Total Bilirubin: 0.7 mg/dL (ref 0.3–1.2)
Total Protein: 7.4 g/dL (ref 6.5–8.1)

## 2022-03-07 LAB — I-STAT BETA HCG BLOOD, ED (MC, WL, AP ONLY): I-stat hCG, quantitative: <5 m[IU]/mL (ref <5)

## 2022-03-07 MED ORDER — LORAZEPAM 2 MG/ML IJ SOLN
1.0000 mg | Freq: Once | INTRAMUSCULAR | Status: AC
  Administered 2022-03-07: 1 mg via INTRAVENOUS
  Filled 2022-03-07: qty 1

## 2022-03-07 MED ORDER — GADOBUTROL 1 MMOL/ML IV SOLN
10.0000 mL | Freq: Once | INTRAVENOUS | Status: AC | PRN
  Administered 2022-03-07: 10 mL via INTRAVENOUS

## 2022-03-07 MED ORDER — LORAZEPAM 2 MG/ML PO CONC: 1.0000 mg | Freq: Once | ORAL | Status: DC

## 2022-03-07 NOTE — ED Triage Notes (Signed)
Pt states that she went to see her eye doctor today and they told her to come to the emergency room because she has fluid behind both eyes. (Bilateral disc edema). Pt only complains of a headache.

## 2022-03-07 NOTE — ED Notes (Signed)
This RN went to MRI to give PRN ativan order to pt due to claustrophobia. Med given.

## 2022-03-07 NOTE — ED Provider Triage Note (Signed)
Emergency Medicine Provider Triage Evaluation Note  Kaitlin Nelson , a 27 y.o. female  was evaluated in triage.  Pt recently evaluated by her eye doctor who told her she has papilledema.  Patient states she has had pain behind her eyes with blurry vision and headache in the last couple weeks.  She denies wearing contact lens. No vision loss.  Review of Systems  Positive: As above Negative: As above  Physical Exam  BP 107/64 (BP Location: Left Arm)   Pulse 65   Temp 97.6 F (36.4 C) (Oral)   Resp 16   Ht 5\' 4"  (1.626 m)   Wt 99.8 kg   SpO2 99%   BMI 37.76 kg/m  Gen:   Awake, no distress   Resp:  Normal effort  MSK:   Moves extremities without difficulty  Other:    Medical Decision Making  Medically screening exam initiated at 7:41 PM.  Appropriate orders placed.  Kaitlin Nelson was informed that the remainder of the evaluation will be completed by another provider, this initial triage assessment does not replace that evaluation, and the importance of remaining in the ED until their evaluation is complete.     Rex Kras, [place] 03/08/22 [phone]

## 2022-03-07 NOTE — ED Notes (Signed)
Patient transported to MRI 

## 2022-03-07 NOTE — ED Notes (Signed)
Pt back from MRI 

## 2022-03-08 ENCOUNTER — Emergency Department (HOSPITAL_COMMUNITY): Payer: BC Managed Care – PPO

## 2022-03-08 LAB — CSF CELL COUNT WITH DIFFERENTIAL
RBC Count, CSF: 0 /mm3
RBC Count, CSF: 182 /mm3 — ABNORMAL HIGH
Tube #: 1
Tube #: 4
WBC, CSF: 1 /mm3 (ref 0–5)
WBC, CSF: 1 /mm3 (ref 0–5)

## 2022-03-08 LAB — GLUCOSE, CSF: Glucose, CSF: 51 mg/dL (ref 40–70)

## 2022-03-08 LAB — PROTEIN, CSF: Total  Protein, CSF: 14 mg/dL — ABNORMAL LOW (ref 15–45)

## 2022-03-08 MED ORDER — ACETAZOLAMIDE 250 MG PO TABS: 500.0000 mg | ORAL_TABLET | Freq: Two times a day (BID) | ORAL | 0 refills | Status: AC

## 2022-03-08 MED ORDER — LIDOCAINE HCL (PF) 1 % IJ SOLN
20.0000 mL | Freq: Once | INTRAMUSCULAR | Status: AC
  Administered 2022-03-08: 10 mL via INTRADERMAL

## 2022-03-08 MED ORDER — ONDANSETRON HCL 4 MG/2ML IJ SOLN
4.0000 mg | Freq: Once | INTRAMUSCULAR | Status: AC
  Administered 2022-03-08: 4 mg via INTRAVENOUS
  Filled 2022-03-08: qty 2

## 2022-03-08 MED ORDER — MORPHINE SULFATE (PF) 4 MG/ML IV SOLN
4.0000 mg | Freq: Once | INTRAVENOUS | Status: AC
  Administered 2022-03-08: 4 mg via INTRAVENOUS
  Filled 2022-03-08: qty 1

## 2022-03-08 MED ORDER — LORAZEPAM 2 MG/ML PO CONC
1.0000 mg | Freq: Once | ORAL | Status: DC
  Filled 2022-03-08: qty 0.5

## 2022-03-08 MED ORDER — ONDANSETRON HCL 4 MG PO TABS: 4.0000 mg | ORAL_TABLET | Freq: Four times a day (QID) | ORAL | 0 refills | Status: AC | PRN

## 2022-03-08 MED ORDER — FENTANYL CITRATE PF 50 MCG/ML IJ SOSY
25.0000 ug | PREFILLED_SYRINGE | Freq: Once | INTRAMUSCULAR | Status: AC
  Administered 2022-03-08: 25 ug via INTRAVENOUS
  Filled 2022-03-08: qty 1

## 2022-03-08 MED ORDER — LORAZEPAM 2 MG/ML IJ SOLN
1.0000 mg | Freq: Once | INTRAMUSCULAR | Status: AC
  Administered 2022-03-08: 1 mg via INTRAVENOUS
  Filled 2022-03-08: qty 1

## 2022-03-08 NOTE — Procedures (Signed)
Technically successful fluoro guided LP at L3-L4 level with opening pressure of 25.5 cm H2O and closing pressure of 22 H2O 9 cc of clear CSF sent to lab for analysis.  No immediate post procedural complication.  Please see imaging section of Epic for full dictation.    Reatha Armour, PA-C 03/08/2022, 3:46 PM

## 2022-03-08 NOTE — ED Provider Notes (Signed)
[facility] DEPT Provider Note   CSN: [id number] Arrival date & time: 03/07/22  1744     History  Chief Complaint  Patient presents with   Eye Problem    Kaitlin Nelson is a 27 y.o. female who presents on referral from optometrist Dr. Marin Comment for bilateral papilledema.  Was found to have normal visual acuity bilaterally in the office.  Patient with mild headaches intermittently for the last 2 weeks, some intermittent blurry vision without vision loss.  Family history with mother with IIH.  Personally reviewed her medical records which she has history of, migraines, bipolar, generalized anxiety disorder, Crohn's disease.  No anticoagulation.  HPI     Home Medications Prior to Admission medications   Medication Sig Start Date End Date Taking? Authorizing Provider  albuterol (VENTOLIN HFA) 108 (90 Base) MCG/ACT inhaler Inhale 2 puffs into the lungs every 6 (six) hours as needed for wheezing or shortness of breath.   Yes Joline Salt, RN  EXCEDRIN MIGRAINE [phone] MG tablet Take 1-2 tablets by mouth every 6 (six) hours as needed for migraine.   Yes [provider]  lamoTRIgine (LAMICTAL) 100 MG tablet Take 1 tablet (100 mg total) by mouth 2 (two) times daily. Patient taking differently: Take 100 mg by mouth at bedtime. 07/21/21  Yes [name], NP  lurasidone (LATUDA) 20 MG TABS tablet Take 20 mg by mouth at bedtime.   Yes [provider]  propranolol (INDERAL) 20 MG tablet Take 20 mg by mouth 2 (two) times daily as needed (for anxiety or as otherwise directed).   Yes [provider]  traZODone (DESYREL) 50 MG tablet Take 1 tablet (50 mg total) by mouth at bedtime as needed for sleep (take one tablet daily at bedtime as needed for sleep, may repeat once in an hour if not asleep). Take 50 mg at bedtime, May repeat once dose in 1 hour if not asleep Patient taking differently: Take 50 mg by mouth at bedtime as needed for sleep  (and may take an additional 50 mg once if still sleepless an hour after the initial dose). 03/28/21 03/07/22 Yes Lord, Asa Saunas, NP  escitalopram (LEXAPRO) 10 MG tablet Take 1 tablet (10 mg total) by mouth daily. Patient not taking: Reported on 03/07/2022 07/21/21 07/21/22  [name], NP  famotidine (PEPCID) 20 MG tablet Take 1 tablet (20 mg total) by mouth 2 (two) times daily. Patient not taking: Reported on 03/07/2022 11/23/21   Teodora Medici, FNP  lamoTRIgine (LAMICTAL) 25 MG tablet TAKE 2 TABLETS BY MOUTH TWICE A DAY Patient not taking: Reported on 03/07/2022 07/21/21   [name], NP      Allergies    Buspar [buspirone], Hydrocodone-acetaminophen, Hydroxyzine, Psyllium, Tape, and Hydrocodone    Review of Systems   Review of Systems  Constitutional: Negative.   HENT: Negative.    Eyes:  Positive for visual disturbance. Negative for photophobia, pain, discharge, redness and itching.  Respiratory: Negative.    Cardiovascular: Negative.   Gastrointestinal: Negative.   Genitourinary: Negative.   Musculoskeletal: Negative.   Neurological:  Positive for headaches. Negative for tremors, seizures, syncope, speech difficulty and weakness.    Physical Exam Updated Vital Signs BP 111/69   Pulse 77   Temp 98.2 F (36.8 C) (Oral)   Resp 17   Ht 5\' 4"  (1.626 m)   Wt 99.8 kg   SpO2 94%   BMI 37.76 kg/m  Physical Exam Vitals and nursing note  reviewed.  Constitutional:      Appearance: She is obese. She is not ill-appearing or toxic-appearing.  HENT:     Head: Normocephalic and atraumatic.     Mouth/Throat:     Mouth: Mucous membranes are moist.     Pharynx: No oropharyngeal exudate or posterior oropharyngeal erythema.  Eyes:     General: Lids are normal. Vision grossly intact.        Right eye: No discharge.        Left eye: No discharge.     Extraocular Movements: Extraocular movements intact.     Conjunctiva/sclera: Conjunctivae normal.     Pupils: Pupils are equal,  round, and reactive to light.  Cardiovascular:     Rate and Rhythm: Normal rate and regular rhythm.     Pulses: Normal pulses.     Heart sounds: Normal heart sounds. No murmur heard. Pulmonary:     Effort: Pulmonary effort is normal. No respiratory distress.     Breath sounds: Normal breath sounds. No wheezing or rales.  Abdominal:     General: Bowel sounds are normal. There is no distension.     Palpations: Abdomen is soft.     Tenderness: There is no abdominal tenderness. There is no guarding or rebound.  Musculoskeletal:     Cervical back: Neck supple.     Right lower leg: No edema.     Left lower leg: No edema.  Skin:    General: Skin is warm and dry.     Capillary Refill: Capillary refill takes less than 2 seconds.  Neurological:     General: No focal deficit present.     Mental Status: She is alert and oriented to person, place, and time. Mental status is at baseline.     GCS: GCS eye subscore is 4. GCS verbal subscore is 5. GCS motor subscore is 6.     Cranial Nerves: Cranial nerves 2-12 are intact.     Sensory: Sensation is intact.     Motor: Motor function is intact.     Coordination: Coordination is intact.     Gait: Gait is intact.  Psychiatric:        Mood and Affect: Mood normal.     ED Results / Procedures / Treatments   Labs (all labs ordered are listed, but only abnormal results are displayed) Labs Reviewed  CBC  COMPREHENSIVE METABOLIC PANEL  I-STAT BETA HCG BLOOD, ED ([facility], [facility], [facility] ONLY)    EKG None  Radiology MR Brain W and Wo Contrast  Result Date: 03/08/2022 CLINICAL DATA:  Initial evaluation for papilledema. EXAM: MRI HEAD AND ORBITS WITHOUT AND WITH CONTRAST TECHNIQUE: Multiplanar, multiecho pulse sequences of the brain and surrounding structures were obtained without and with intravenous contrast. Multiplanar, multiecho pulse sequences of the orbits and surrounding structures were obtained including fat saturation techniques, before and after  intravenous contrast administration. CONTRAST:  63mL GADAVIST GADOBUTROL 1 MMOL/ML IV SOLN COMPARISON:  Prior CT from 12/28/2018. FINDINGS: MRI HEAD FINDINGS Brain: Examination technically limited by extensive susceptibility artifact from dental hardware. Cerebral volume within normal limits. No visible focal parenchymal signal abnormality. No evidence for acute or subacute ischemia. Gray-white matter differentiation maintained. No areas of chronic cortical infarct or other insult. No visible acute or chronic intracranial blood products. No mass lesion, midline shift or mass effect. No hydrocephalus or extra-axial fluid collection. Pituitary gland and suprasellar region normal. No abnormal enhancement. Vascular: Major intracranial vascular flow voids are maintained. Skull and upper cervical spine:  Craniocervical junction within normal limits. Bone marrow signal intensity normal. No scalp soft tissue abnormality. Other: Mastoid air cells are largely clear. MRI ORBITS FINDINGS Orbits: Evaluation of the globes and orbital soft tissues limited by susceptibility artifact from dental hardware. Globes grossly symmetric in size with normal morphology. No abnormality seen along either optic nerve or optic nerve sheath complex. Intraconal and extraconal fat maintained. Extra-ocular muscles symmetric and grossly normal. Lacrimal glands grossly within normal limits. No abnormality about the orbital apices or cavernous sinus. Superior orbital veins grossly symmetric and within normal limits. Optic chiasm normally situated within the suprasellar cistern without abnormality. Visualized sinuses: No more than mild mucosal thickening noted about the ethmoidal air cells. Visualized paranasal sinuses are otherwise largely clear. Soft tissues: No visible abnormality about the periorbital soft tissues. IMPRESSION: 1. Technically limited exam due to extensive susceptibility artifact from dental hardware. 2. Grossly normal MRI of the brain  and orbits. No findings to explain patient's symptoms identified. Electronically Signed   By: Rise Mu M.D.   On: 03/08/2022 00:03   MR ORBITS W WO CONTRAST  Result Date: 03/08/2022 CLINICAL DATA:  Initial evaluation for papilledema. EXAM: MRI HEAD AND ORBITS WITHOUT AND WITH CONTRAST TECHNIQUE: Multiplanar, multiecho pulse sequences of the brain and surrounding structures were obtained without and with intravenous contrast. Multiplanar, multiecho pulse sequences of the orbits and surrounding structures were obtained including fat saturation techniques, before and after intravenous contrast administration. CONTRAST:  40mL GADAVIST GADOBUTROL 1 MMOL/ML IV SOLN COMPARISON:  Prior CT from 12/28/2018. FINDINGS: MRI HEAD FINDINGS Brain: Examination technically limited by extensive susceptibility artifact from dental hardware. Cerebral volume within normal limits. No visible focal parenchymal signal abnormality. No evidence for acute or subacute ischemia. Gray-white matter differentiation maintained. No areas of chronic cortical infarct or other insult. No visible acute or chronic intracranial blood products. No mass lesion, midline shift or mass effect. No hydrocephalus or extra-axial fluid collection. Pituitary gland and suprasellar region normal. No abnormal enhancement. Vascular: Major intracranial vascular flow voids are maintained. Skull and upper cervical spine: Craniocervical junction within normal limits. Bone marrow signal intensity normal. No scalp soft tissue abnormality. Other: Mastoid air cells are largely clear. MRI ORBITS FINDINGS Orbits: Evaluation of the globes and orbital soft tissues limited by susceptibility artifact from dental hardware. Globes grossly symmetric in size with normal morphology. No abnormality seen along either optic nerve or optic nerve sheath complex. Intraconal and extraconal fat maintained. Extra-ocular muscles symmetric and grossly normal. Lacrimal glands grossly  within normal limits. No abnormality about the orbital apices or cavernous sinus. Superior orbital veins grossly symmetric and within normal limits. Optic chiasm normally situated within the suprasellar cistern without abnormality. Visualized sinuses: No more than mild mucosal thickening noted about the ethmoidal air cells. Visualized paranasal sinuses are otherwise largely clear. Soft tissues: No visible abnormality about the periorbital soft tissues. IMPRESSION: 1. Technically limited exam due to extensive susceptibility artifact from dental hardware. 2. Grossly normal MRI of the brain and orbits. No findings to explain patient's symptoms identified. Electronically Signed   By: Rise Mu M.D.   On: 03/08/2022 00:03    Procedures .Critical Care  Performed by: Paris Lore, PA-C Authorized by: Paris Lore, PA-C   Critical care provider statement:    Critical care time (minutes):  45   Critical care was time spent personally by me on the following activities:  Development of treatment plan with patient or surrogate, discussions with consultants, evaluation of patient's response to treatment,  examination of patient, obtaining history from patient or surrogate, ordering and performing treatments and interventions, ordering and review of laboratory studies, ordering and review of radiographic studies, pulse oximetry and re-evaluation of patient's condition     Medications Ordered in ED Medications  LORazepam (ATIVAN) injection 1 mg (1 mg Intravenous Given 03/07/22 2242)  gadobutrol (GADAVIST) 1 MMOL/ML injection 10 mL (10 mLs Intravenous Contrast Given 03/07/22 2328)  LORazepam (ATIVAN) injection 1 mg (1 mg Intravenous Given 03/08/22 0520)  fentaNYL (SUBLIMAZE) injection 25 mcg (25 mcg Intravenous Given 03/08/22 0557)  ondansetron (ZOFRAN) injection 4 mg (4 mg Intravenous Given 03/08/22 6644)    ED Course/ Medical Decision Making/ A&P Clinical Course as of 03/08/22 0704   Thu Mar 08, 2022  0153 Consult call received from Dr. Sherryll Burger, ophthalmologist, who states that given reassuring MR must rule out IIH.  Recommends proceeding with lumbar puncture; if high opening pressures, IIH would be confirmed and he would recommend initiating patient on Diamox and discharging her with outpatient neurology follow-up. If lumbar puncture unsuccessful in the emergency department he would recommend outpatient lumbar puncture with IR as well as neurology follow-up, cannot start Diamox without confirmed IIH.  Regardless, Dr. Sherryll Burger agrees to see patient in the outpatient setting in the next 1 to 2 weeks for follow-up.  I appreciate his collaboration in the care of this patient. [RS]  0347 EDP Dr. Eudelia Bunch attempted lumbar puncture in the emergency department, unfortunately unsuccessful.  Consult pending to interventional radiology to schedule IR LP. [RS]  [phone] Consult call received from IR technologist Shanda Bumps who recommends repeating consultation to IR after providers arrive at 730.  She will pass the information along but states that repeat consultation will be more reliable.  I appreciate her collaboration in the care of this patient. [RS]    Clinical Course User Index [RS] Arabella Revelle, Eugene Gavia, PA-C                           Medical Decision Making 27 year old female presents with concern for headaches and blurry vision, referred to ED by optometrist for bilateral papilledema.  Vital signs normal on intake.  Cardiopulmonary exam normal, abdominal exam is benign.  Patient without focal deficit on neurologic exam.  DDx includes is limited to IIH,, intracranial mass, hydrocephalus, cerebral edema migraine or tension type headaches, CVA, cerebral venous thrombus, intracranial hemorrhage.  CT  Amount and/or Complexity of Data Reviewed Labs:     Details: CBC without leukocytosis or anemia, CMP unremarkable, patient is not pregnant. Radiology:     Details: MRI technically limited  secondary to artifact from dental hardware, however grossly normal MRI of the brain and orbits without evidence of intracranial mass or venous sinus thrombosis. Discussion of management or test interpretation with external provider(s): Consult to ophthalmology as above, patient will require LP.  Risk Prescription drug management.   First attempt for LP in the emergency department unsuccessful.  Patient will require IR LP.  Consult to IR as above, care of this patient signed out to oncoming ED provider Fayrene Helper, PA-C at time of shift change.  All pertinent HPI physical exam laboratory findings were discussed with him prior to my departure.  Disposition pending results of LP.  Johnika voiced understanding of her medical evaluation and treatment plan. Each of their questions answered to their expressed satisfaction.  This chart was dictated using voice recognition software, Dragon. Despite the best efforts of this provider to proofread and correct errors,  errors may still occur which can change documentation meaning.    Final Clinical Impression(s) / ED Diagnoses Final diagnoses:  None    Rx / DC Orders ED Discharge Orders     None         Sherrilee Gilles 03/08/22 0704    Nira Conn, MD 03/09/22 [phone]

## 2022-03-08 NOTE — ED Notes (Signed)
Consent signed for lumbar puncture procedure.

## 2022-03-08 NOTE — ED Notes (Signed)
Patient to IR

## 2022-03-08 NOTE — ED Provider Notes (Signed)
Received signout from previous provider, please see her note for complete H&P.  This is a 27 year old female who was referred to the ER by her optometrist for bilateral papilledema.  Patient has had intermittent headache for the past 2 weeks with intermittent blurry vision but no complaints of vision loss.  There is a family history of idiopathic intracranial hypertension.  Patient initially was evaluated by previous provider, and an LP was attempted without success.  An MRI was obtained independently viewed interpreted by me and overall reassuring.  Request for IR LP was placed but unfortunately it would not be done into 2 PM today.  Patient made aware of plan.  On exam pt is resting comfortably in no acute discomfort.  No focal neuro deficit, no acute vision changes, no nuchal rigidity.  Vital sign overall reassuring. Labs obtain independently view and interpreted by me and are reassuring.  Imaging including brain MRI and orbits obtained independently viewed and interpreted by me and I agrees with radiologist's interpretation.  MRI without acute finding.  2:43 PM Pt have been transfer to IR suite for IR LP.  Pt was given morphine for headache and back pain while waiting for procedure.    PLAN:  If evidence of IIH, then start diamox and f/o with neuro outpt.   Pt sign out to oncoming team to f/u on result  BP 103/62   Pulse 71   Temp 97.6 F (36.4 C) (Oral)   Resp 18   Ht 5\' 4"  (1.626 m)   Wt 99.8 kg   SpO2 96%   BMI 37.76 kg/m   Results for orders placed or performed during the hospital encounter of 03/07/22  CBC  Result Value Ref Range   WBC 8.7 4.0 - 10.5 K/uL   RBC 4.37 3.87 - 5.11 MIL/uL   Hemoglobin 13.0 12.0 - 15.0 g/dL   HCT 40.2 36.0 - 46.0 %   MCV 92.0 80.0 - 100.0 fL   MCH 29.7 26.0 - 34.0 pg   MCHC 32.3 30.0 - 36.0 g/dL   RDW 12.6 11.5 - 15.5 %   Platelets 298 150 - 400 K/uL   nRBC 0.0 0.0 - 0.2 %  Comprehensive metabolic panel  Result Value Ref Range   Sodium 137  135 - 145 mmol/L   Potassium 3.9 3.5 - 5.1 mmol/L   Chloride 107 98 - 111 mmol/L   CO2 22 22 - 32 mmol/L   Glucose, Bld 86 70 - 99 mg/dL   BUN 13 6 - 20 mg/dL   Creatinine, Ser 0.86 0.44 - 1.00 mg/dL   Calcium 8.9 8.9 - 10.3 mg/dL   Total Protein 7.4 6.5 - 8.1 g/dL   Albumin 3.7 3.5 - 5.0 g/dL   AST 18 15 - 41 U/L   ALT 13 0 - 44 U/L   Alkaline Phosphatase 56 38 - 126 U/L   Total Bilirubin 0.7 0.3 - 1.2 mg/dL   GFR, Estimated >60 >60 mL/min   Anion gap 8 5 - 15  I-Stat Beta hCG blood, ED ([facility], [facility], [facility] only)  Result Value Ref Range   I-stat hCG, quantitative <5.0 <5 mIU/mL   Comment 3           MR Brain W and Wo Contrast  Result Date: 03/08/2022 CLINICAL DATA:  Initial evaluation for papilledema. EXAM: MRI HEAD AND ORBITS WITHOUT AND WITH CONTRAST TECHNIQUE: Multiplanar, multiecho pulse sequences of the brain and surrounding structures were obtained without and with intravenous contrast. Multiplanar, multiecho pulse  sequences of the orbits and surrounding structures were obtained including fat saturation techniques, before and after intravenous contrast administration. CONTRAST:  37mL GADAVIST GADOBUTROL 1 MMOL/ML IV SOLN COMPARISON:  Prior CT from 12/28/2018. FINDINGS: MRI HEAD FINDINGS Brain: Examination technically limited by extensive susceptibility artifact from dental hardware. Cerebral volume within normal limits. No visible focal parenchymal signal abnormality. No evidence for acute or subacute ischemia. Gray-white matter differentiation maintained. No areas of chronic cortical infarct or other insult. No visible acute or chronic intracranial blood products. No mass lesion, midline shift or mass effect. No hydrocephalus or extra-axial fluid collection. Pituitary gland and suprasellar region normal. No abnormal enhancement. Vascular: Major intracranial vascular flow voids are maintained. Skull and upper cervical spine: Craniocervical junction within normal limits. Bone marrow signal  intensity normal. No scalp soft tissue abnormality. Other: Mastoid air cells are largely clear. MRI ORBITS FINDINGS Orbits: Evaluation of the globes and orbital soft tissues limited by susceptibility artifact from dental hardware. Globes grossly symmetric in size with normal morphology. No abnormality seen along either optic nerve or optic nerve sheath complex. Intraconal and extraconal fat maintained. Extra-ocular muscles symmetric and grossly normal. Lacrimal glands grossly within normal limits. No abnormality about the orbital apices or cavernous sinus. Superior orbital veins grossly symmetric and within normal limits. Optic chiasm normally situated within the suprasellar cistern without abnormality. Visualized sinuses: No more than mild mucosal thickening noted about the ethmoidal air cells. Visualized paranasal sinuses are otherwise largely clear. Soft tissues: No visible abnormality about the periorbital soft tissues. IMPRESSION: 1. Technically limited exam due to extensive susceptibility artifact from dental hardware. 2. Grossly normal MRI of the brain and orbits. No findings to explain patient's symptoms identified. Electronically Signed   By: Jeannine Boga M.D.   On: 03/08/2022 00:03   MR ORBITS W WO CONTRAST  Result Date: 03/08/2022 CLINICAL DATA:  Initial evaluation for papilledema. EXAM: MRI HEAD AND ORBITS WITHOUT AND WITH CONTRAST TECHNIQUE: Multiplanar, multiecho pulse sequences of the brain and surrounding structures were obtained without and with intravenous contrast. Multiplanar, multiecho pulse sequences of the orbits and surrounding structures were obtained including fat saturation techniques, before and after intravenous contrast administration. CONTRAST:  60mL GADAVIST GADOBUTROL 1 MMOL/ML IV SOLN COMPARISON:  Prior CT from 12/28/2018. FINDINGS: MRI HEAD FINDINGS Brain: Examination technically limited by extensive susceptibility artifact from dental hardware. Cerebral volume within  normal limits. No visible focal parenchymal signal abnormality. No evidence for acute or subacute ischemia. Gray-white matter differentiation maintained. No areas of chronic cortical infarct or other insult. No visible acute or chronic intracranial blood products. No mass lesion, midline shift or mass effect. No hydrocephalus or extra-axial fluid collection. Pituitary gland and suprasellar region normal. No abnormal enhancement. Vascular: Major intracranial vascular flow voids are maintained. Skull and upper cervical spine: Craniocervical junction within normal limits. Bone marrow signal intensity normal. No scalp soft tissue abnormality. Other: Mastoid air cells are largely clear. MRI ORBITS FINDINGS Orbits: Evaluation of the globes and orbital soft tissues limited by susceptibility artifact from dental hardware. Globes grossly symmetric in size with normal morphology. No abnormality seen along either optic nerve or optic nerve sheath complex. Intraconal and extraconal fat maintained. Extra-ocular muscles symmetric and grossly normal. Lacrimal glands grossly within normal limits. No abnormality about the orbital apices or cavernous sinus. Superior orbital veins grossly symmetric and within normal limits. Optic chiasm normally situated within the suprasellar cistern without abnormality. Visualized sinuses: No more than mild mucosal thickening noted about the ethmoidal air cells. Visualized paranasal  sinuses are otherwise largely clear. Soft tissues: No visible abnormality about the periorbital soft tissues. IMPRESSION: 1. Technically limited exam due to extensive susceptibility artifact from dental hardware. 2. Grossly normal MRI of the brain and orbits. No findings to explain patient's symptoms identified. Electronically Signed   By: Rise Mu M.D.   On: 03/08/2022 00:03      Fayrene Helper, PA-C 03/08/22 1446    Bethann Berkshire, MD 03/10/22 [phone]

## 2022-03-08 NOTE — ED Notes (Addendum)
Paged Neurology at [phone]

## 2022-03-08 NOTE — Discharge Instructions (Addendum)
Please follow-up with your primary care doctor, Dr. Manuella Ghazi the the myalgias, and [facility] as directed.  Take antiemetics twice a day, and I will provide you with a 30-day supply until you can get one filled by your primary care doctor or neurology.  If you have numbness, tingling, worsening pain, headaches please return to the ER.

## 2022-03-08 NOTE — ED Provider Notes (Signed)
Received sign out from B. Rona Ravens PA, pending lumbar puncture, reassessment and consult with neurology if elevated pressure for possible diamox.   Physical Exam  BP 103/62   Pulse 71   Temp 97.6 F (36.4 C) (Oral)   Resp 18   Ht 5\' 4"  (1.626 m)   Wt 99.8 kg   SpO2 96%   BMI 37.76 kg/m   Physical Exam Vitals and nursing note reviewed.  Constitutional:      General: She is not in acute distress.    Appearance: She is well-developed.  HENT:     Head: Normocephalic and atraumatic.  Eyes:     Conjunctiva/sclera: Conjunctivae normal.  Cardiovascular:     Rate and Rhythm: Normal rate and regular rhythm.     Heart sounds: No murmur heard. Pulmonary:     Effort: Pulmonary effort is normal. No respiratory distress.     Breath sounds: Normal breath sounds.  Abdominal:     Palpations: Abdomen is soft.     Tenderness: There is no abdominal tenderness.  Musculoskeletal:        General: No swelling.     Cervical back: Neck supple.  Skin:    General: Skin is warm and dry.     Capillary Refill: Capillary refill takes less than 2 seconds.  Neurological:     Mental Status: She is alert.  Psychiatric:        Mood and Affect: Mood normal.     Procedures  Procedures  ED Course / MDM   Clinical Course as of 03/08/22 1507  Thu Mar 08, 2022  0153 Consult call received from Dr. Manuella Ghazi, ophthalmologist, who states that given reassuring MR must rule out IIH.  Recommends proceeding with lumbar puncture; if high opening pressures, IIH would be confirmed and he would recommend initiating patient on Diamox and discharging her with outpatient neurology follow-up. If lumbar puncture unsuccessful in the emergency department he would recommend outpatient lumbar puncture with IR as well as neurology follow-up, cannot start Diamox without confirmed IIH.  Regardless, Dr. Manuella Ghazi agrees to see patient in the outpatient setting in the next 1 to 2 weeks for follow-up.  I appreciate his collaboration in the care  of this patient. [RS]  5009 EDP Dr. Leonette Monarch attempted lumbar puncture in the emergency department, unfortunately unsuccessful.  Consult pending to interventional radiology to schedule IR LP. [RS]  [phone] Consult call received from IR technologist Janett Billow who recommends repeating consultation to IR after providers arrive at 730.  She will pass the information along but states that repeat consultation will be more reliable.  I appreciate her collaboration in the care of this patient. [RS]    Clinical Course User Index [RS] Sponseller, Gypsy Balsam, PA-C   Medical Decision Making Received handoff pending LP results, see other notes for further information.  She presents secondary to concerns for papilledema from her ophthalmologist, and received an MRI of her brain and orbits that were unremarkable.  She has been having intermittent headaches for the last couple weeks, and her mother has IIH.  LP was performed by IR, and showed an elevated pressure of 25.5, I discussed this with Dr. Leonel Ramsay the neurologist on-call, and he recommended start Diamox 500 mg twice a day and follow-up with neurology.  She was provided with referral information, and instructed to follow-up with ophthalmology as well.  Dr. Leonel Ramsay said that given her papilledema noted by the optometrist, and elevated opening pressure this is enough to make the diagnosis of IIH.  Diamox was sent to the pharmacy and she was encouraged to follow-up with her primary care in the next week or 2, to make sure that her electrolytes are within normal limits and she needs no supplementation after being started on Diamox.  She is in no acute distress she was provided with information for post lumbar puncture instructions and return precautions.  Amount and/or Complexity of Data Reviewed Labs: ordered. Radiology: ordered.  Risk Prescription drug management.          Kaitlin Nelson, [place] 03/08/22 1726    Valarie Merino, MD 03/09/22 [phone]

## 2022-03-09 NOTE — ED Provider Notes (Signed)
Attestation: Medical screening examination/treatment/procedure(s) were conducted as a shared visit with non-physician practitioner(s) and myself.  I personally evaluated the patient during the encounter.   Briefly, the patient is a 27 y.o. female here for headache and blurry vision noted to have papilledema by eye doctor.   Vitals:   03/08/22 1700 03/08/22 1730  BP: 136/71 117/86  Pulse: 61 76  Resp: 16 14  Temp:  98.1 F (36.7 C)  SpO2: 97% 99%       EKG Interpretation  Date/Time:    Ventricular Rate:    PR Interval:    QRS Duration:   QT Interval:    QTC Calculation:   R Axis:     Text Interpretation:          .Lumbar Puncture  Date/Time: 03/09/2022 4:53 AM  Performed by: Fatima Blank, MD Authorized by: Fatima Blank, MD   Consent:    Consent obtained:  Written   Consent given by:  Patient   Risks discussed:  Bleeding, infection, nerve damage, headache, pain and repeat procedure   Alternatives discussed:  No treatment Universal protocol:    Procedure explained and questions answered to patient or proxy's satisfaction: yes     Imaging studies available: yes     Patient identity confirmed:  Verbally with patient and arm band Pre-procedure details:    Procedure purpose:  Diagnostic   Preparation: Patient was prepped and draped in usual sterile fashion   Anesthesia:    Anesthesia method:  Local infiltration   Local anesthetic:  Lidocaine 1% w/o epi Procedure details:    Lumbar space:  L3-L4 interspace   Patient position:  R lateral decubitus   Needle gauge:  20   Needle type:  Spinal needle - Quincke tip   Needle length (in): 7 in.   Number of attempts:  3 Comments:     Unsuccessful attempts     Lyell Clugston, Grayce Sessions, MD 03/09/22 [phone]

## 2022-03-12 ENCOUNTER — Telehealth: Payer: Self-pay | Admitting: Emergency Medicine

## 2022-03-12 LAB — CSF CULTURE W GRAM STAIN
Culture: NO GROWTH
Gram Stain: NONE SEEN

## 2022-03-12 NOTE — Telephone Encounter (Signed)
Patient called in to schedule ED f/u for IIH. Patient has bad debt, sent to [facility] in billing. Patient to c/b to make a payment before scheduling appt. I have appointment on hold for her 03/13/22 @ 8:30AM if patient calls back.

## 2022-03-13 ENCOUNTER — Other Ambulatory Visit: Payer: Self-pay

## 2022-03-13 ENCOUNTER — Emergency Department (HOSPITAL_COMMUNITY)
Admission: EM | Admit: 2022-03-13 | Discharge: 2022-03-13 | Disposition: A | Discharge status: Home / Self Care | Payer: BC Managed Care – PPO | Attending: Emergency Medicine | Admitting: Emergency Medicine

## 2022-03-13 ENCOUNTER — Emergency Department (HOSPITAL_COMMUNITY): Payer: BC Managed Care – PPO

## 2022-03-13 DIAGNOSIS — R7989 Other specified abnormal findings of blood chemistry: Secondary | ICD-10-CM | POA: Insufficient documentation

## 2022-03-13 DIAGNOSIS — R519 Headache, unspecified: Secondary | ICD-10-CM | POA: Diagnosis not present

## 2022-03-13 DIAGNOSIS — Z79899 Other long term (current) drug therapy: Secondary | ICD-10-CM | POA: Insufficient documentation

## 2022-03-13 DIAGNOSIS — G932 Benign intracranial hypertension: Secondary | ICD-10-CM | POA: Diagnosis not present

## 2022-03-13 DIAGNOSIS — M5431 Sciatica, right side: Secondary | ICD-10-CM

## 2022-03-13 DIAGNOSIS — R200 Anesthesia of skin: Secondary | ICD-10-CM | POA: Diagnosis present

## 2022-03-13 DIAGNOSIS — M5441 Lumbago with sciatica, right side: Secondary | ICD-10-CM | POA: Insufficient documentation

## 2022-03-13 LAB — CBC WITH DIFFERENTIAL/PLATELET
Abs Immature Granulocytes: 0.01 10*3/uL (ref 0.00–0.07)
Basophils Absolute: 0 10*3/uL (ref 0.0–0.1)
Basophils Relative: 1 %
Eosinophils Absolute: 0.1 10*3/uL (ref 0.0–0.5)
Eosinophils Relative: 2 %
HCT: 40.6 % (ref 36.0–46.0)
Hemoglobin: 13 g/dL (ref 12.0–15.0)
Immature Granulocytes: 0 %
Lymphocytes Relative: 40 %
Lymphs Abs: 2.4 10*3/uL (ref 0.7–4.0)
MCH: 29.9 pg (ref 26.0–34.0)
MCHC: 32 g/dL (ref 30.0–36.0)
MCV: 93.3 fL (ref 80.0–100.0)
Monocytes Absolute: 0.5 10*3/uL (ref 0.1–1.0)
Monocytes Relative: 8 %
Neutro Abs: 2.9 10*3/uL (ref 1.7–7.7)
Neutrophils Relative %: 49 %
Platelets: 292 10*3/uL (ref 150–400)
RBC: 4.35 MIL/uL (ref 3.87–5.11)
RDW: 12.7 % (ref 11.5–15.5)
WBC: 5.9 10*3/uL (ref 4.0–10.5)
nRBC: 0 % (ref 0.0–0.2)

## 2022-03-13 LAB — BASIC METABOLIC PANEL
Anion gap: 6 (ref 5–15)
BUN: 15 mg/dL (ref 6–20)
CO2: 18 mmol/L — ABNORMAL LOW (ref 22–32)
Calcium: 8.6 mg/dL — ABNORMAL LOW (ref 8.9–10.3)
Chloride: 113 mmol/L — ABNORMAL HIGH (ref 98–111)
Creatinine, Ser: 1.06 mg/dL — ABNORMAL HIGH (ref 0.44–1.00)
GFR, Estimated: >60 mL/min (ref >60)
Glucose, Bld: 92 mg/dL (ref 70–99)
Potassium: 3.5 mmol/L (ref 3.5–5.1)
Sodium: 137 mmol/L (ref 135–145)

## 2022-03-13 LAB — PREGNANCY, URINE: Preg Test, Ur: NEGATIVE

## 2022-03-13 MED ORDER — DEXAMETHASONE SODIUM PHOSPHATE 10 MG/ML IJ SOLN: 10.0000 mg | Freq: Once | INTRAMUSCULAR | Status: DC

## 2022-03-13 MED ORDER — LORAZEPAM 1 MG PO TABS
1.0000 mg | ORAL_TABLET | Freq: Once | ORAL | Status: AC | PRN
  Administered 2022-03-13: 1 mg via ORAL
  Filled 2022-03-13: qty 1

## 2022-03-13 MED ORDER — PREDNISONE 50 MG PO TABS: 50.0000 mg | ORAL_TABLET | Freq: Every day | ORAL | 0 refills | Status: DC

## 2022-03-13 MED ORDER — CELECOXIB 200 MG PO CAPS: 200.0000 mg | ORAL_CAPSULE | Freq: Two times a day (BID) | ORAL | 0 refills | Status: DC

## 2022-03-13 MED ORDER — SODIUM CHLORIDE 0.9 % IV BOLUS: 1000.0000 mL | Freq: Once | INTRAVENOUS | Status: DC

## 2022-03-13 MED ORDER — KETOROLAC TROMETHAMINE 30 MG/ML IJ SOLN: 30.0000 mg | Freq: Once | INTRAMUSCULAR | Status: DC

## 2022-03-13 NOTE — ED Provider Notes (Signed)
[facility]PT Provider Note   CSN: [id number] Arrival date & time: 03/13/22  1001     History  Chief Complaint  Patient presents with   Numbness    Kaitlin Nelson is a 27 y.o. female.  Pt is a 27 yo with pmhx significant for a recent dx of idiopathic intracranial hypertension.  She was seen by ophthalmology and was noted to have papilledema.  She was sent to the ED and and an LP done by IR on 1/11.  She had a slightly elevated opening pressure of 25.5.  Diamox was started at 500 bid.  She was instructed to f/u with neuro.  According to notes on epic, pt had an appt today at 0830, but she said she was not aware of this.  It also looks like she had a bad debt and could not get an appt until that was cleared.  So, that is probably why she was unaware of the appt.    Since the lp, she's been having numbness and pain to her feet.  She said her headache is better.  Her vision is fine.  She's been able to walk.         Home Medications Prior to Admission medications   Medication Sig Start Date End Date Taking? Authorizing Provider  celecoxib (CELEBREX) 200 MG capsule Take 1 capsule (200 mg total) by mouth 2 (two) times daily. 03/13/22  Yes Jacalyn Lefevre, MD  predniSONE (DELTASONE) 50 MG tablet Take 1 tablet (50 mg total) by mouth daily with breakfast. 03/13/22  Yes Jacalyn Lefevre, MD  acetaZOLAMIDE (DIAMOX) 250 MG tablet Take 2 tablets (500 mg total) by mouth in the morning and at bedtime. 03/08/22   Small, Brooke L, PA  albuterol (VENTOLIN HFA) 108 (90 Base) MCG/ACT inhaler Inhale 2 puffs into the lungs every 6 (six) hours as needed for wheezing or shortness of breath.    Alver Fisher, RN  escitalopram (LEXAPRO) 10 MG tablet Take 1 tablet (10 mg total) by mouth daily. Patient not taking: Reported on 03/07/2022 07/21/21 07/21/22  Charm Rings, NP  EXCEDRIN MIGRAINE [id number] MG tablet Take 1-2 tablets by mouth every 6 (six) hours as needed for  migraine.    [provider]  famotidine (PEPCID) 20 MG tablet Take 1 tablet (20 mg total) by mouth 2 (two) times daily. Patient not taking: Reported on 03/07/2022 11/23/21   Gustavus Bryant, FNP  lamoTRIgine (LAMICTAL) 100 MG tablet Take 1 tablet (100 mg total) by mouth 2 (two) times daily. Patient taking differently: Take 100 mg by mouth at bedtime. 07/21/21   Charm Rings, NP  lamoTRIgine (LAMICTAL) 25 MG tablet TAKE 2 TABLETS BY MOUTH TWICE A DAY Patient not taking: Reported on 03/07/2022 07/21/21   Charm Rings, NP  lurasidone (LATUDA) 20 MG TABS tablet Take 20 mg by mouth at bedtime.    [provider]  ondansetron (ZOFRAN) 4 MG tablet Take 1 tablet (4 mg total) by mouth every 6 (six) hours as needed for nausea or vomiting. 03/08/22   Small, Brooke L, PA  propranolol (INDERAL) 20 MG tablet Take 20 mg by mouth 2 (two) times daily as needed (for anxiety or as otherwise directed).    [provider]  traZODone (DESYREL) 50 MG tablet Take 1 tablet (50 mg total) by mouth at bedtime as needed for sleep (take one tablet daily at bedtime as needed for sleep, may repeat once in an hour if not asleep). Take  50 mg at bedtime, May repeat once dose in 1 hour if not asleep Patient taking differently: Take 50 mg by mouth at bedtime as needed for sleep (and may take an additional 50 mg once if still sleepless an hour after the initial dose). 03/28/21 03/07/22  Charm Rings, NP      Allergies    Buspar [buspirone], Hydrocodone-acetaminophen, Hydroxyzine, Psyllium, Tape, and Hydrocodone    Review of Systems   Review of Systems  Neurological:  Positive for numbness.  All other systems reviewed and are negative.   Physical Exam Updated Vital Signs BP 105/72 (BP Location: Right Arm)   Pulse 62   Temp 98.2 F (36.8 C) (Oral)   Resp 16   SpO2 95%  Physical Exam Vitals and nursing note reviewed.  Constitutional:      Appearance: Normal appearance. She is obese.  HENT:      Head: Normocephalic and atraumatic.     Right Ear: External ear normal.     Left Ear: External ear normal.     Nose: Nose normal.     Mouth/Throat:     Mouth: Mucous membranes are moist.     Pharynx: Oropharynx is clear.  Eyes:     Extraocular Movements: Extraocular movements intact.     Conjunctiva/sclera: Conjunctivae normal.     Pupils: Pupils are equal, round, and reactive to light.  Cardiovascular:     Rate and Rhythm: Normal rate and regular rhythm.     Pulses: Normal pulses.     Heart sounds: Normal heart sounds.  Pulmonary:     Effort: Pulmonary effort is normal.     Breath sounds: Normal breath sounds.  Abdominal:     General: Abdomen is flat. Bowel sounds are normal.     Palpations: Abdomen is soft.  Musculoskeletal:        General: Normal range of motion.     Cervical back: Normal range of motion and neck supple.  Skin:    General: Skin is warm.     Capillary Refill: Capillary refill takes less than 2 seconds.  Neurological:     General: No focal deficit present.     Mental Status: She is alert and oriented to person, place, and time.  Psychiatric:        Mood and Affect: Mood normal.        Behavior: Behavior normal.     ED Results / Procedures / Treatments   Labs (all labs ordered are listed, but only abnormal results are displayed) Labs Reviewed  BASIC METABOLIC PANEL - Abnormal; Notable for the following components:      Result Value   Chloride 113 (*)    CO2 18 (*)    Creatinine, Ser 1.06 (*)    Calcium 8.6 (*)    All other components within normal limits  CBC WITH DIFFERENTIAL/PLATELET  PREGNANCY, URINE  LACTIC ACID, PLASMA  LACTIC ACID, PLASMA  BLOOD GAS, VENOUS    EKG None  Radiology MR LUMBAR SPINE WO CONTRAST  Result Date: 03/13/2022 CLINICAL DATA:  Low back pain, cauda equina syndrome suspected Tech note: Pt complains of tingling to bilateral feet and pain x1 week since after spinal tap. Denies any leg pain, endorses slight back  tightness after spinal tap. EXAM: MRI LUMBAR SPINE WITHOUT CONTRAST TECHNIQUE: Multiplanar, multisequence MR imaging of the lumbar spine was performed. No intravenous contrast was administered. COMPARISON:  None Available. FINDINGS: Segmentation:  Standard segmentation is assumed. Alignment:  No substantial sagittal subluxation. Vertebrae:  No fracture, evidence of discitis, or bone lesion. Conus medullaris and cauda equina: Conus extends to the T12-L1 level. Conus appears normal. Paraspinal and other soft tissues: Edema within the posterior paraspinal soft tissues, predominately at L4-L5. Disc levels: T12-L1: No significant disc protrusion, foraminal stenosis, or canal stenosis. L1-L2: No significant disc protrusion, foraminal stenosis, or canal stenosis. L2-L3: No significant disc protrusion, foraminal stenosis, or canal stenosis. L3-L4: No significant disc protrusion, foraminal stenosis, or canal stenosis. L4-L5: Disc desiccation. Mild disc bulging and facet arthropathy with mild left foraminal stenosis. Patent canal and right foramen. L5-S1: Disc desiccation and mild height loss. Right far lateral/extraforaminal disc protrusions with mild to moderate associated right narrowing. No significant canal or left foraminal stenosis. IMPRESSION: 1. At L5-S1, mild to moderate right far lateral foraminal/extraforaminal narrowing due to disc protrusion. 2. At L4-L5, mild left foraminal stenosis. 3. Edema within the posterior paraspinal soft tissues, predominately at L4-L5. This is nonspecific but may be related to recent reported lumbar puncture. No discrete fluid collection. Electronically Signed   By: Margaretha Sheffield M.D.   On: 03/13/2022 14:49    Procedures Procedures    Medications Ordered in ED Medications  dexamethasone (DECADRON) injection 10 mg (has no administration in time range)  LORazepam (ATIVAN) tablet 1 mg (1 mg Oral Given 03/13/22 1350)    ED Course/ Medical Decision Making/ A&P                              Medical Decision Making Amount and/or Complexity of Data Reviewed Labs: ordered. Radiology: ordered.  Risk Prescription drug management.   This patient presents to the ED for concern of tingling, this involves an extensive number of treatment options, and is a complaint that carries with it a high risk of complications and morbidity.  The differential diagnosis includes electrolyte abn, spinal hematoma   Co morbidities that complicate the patient evaluation  IIH   Additional history obtained:  Additional history obtained from epic chart review    Lab Tests:  I Ordered, and personally interpreted labs.  The pertinent results include:  bmp with CO2 18, Cr sl elevated at 1.06; cbc neg; preg neg   Imaging Studies ordered:  I ordered imaging studies including MRI  I independently visualized and interpreted imaging which showed  At L5-S1, mild to moderate right far lateral  foraminal/extraforaminal narrowing due to disc protrusion.  2. At L4-L5, mild left foraminal stenosis.  3. Edema within the posterior paraspinal soft tissues, predominately  at L4-L5. This is nonspecific but may be related to recent reported  lumbar puncture. No discrete fluid collection.   I agree with the radiologist interpretation   Medicines ordered and prescription drug management:  I ordered medication including decadron  for sciatica  Reevaluation of the patient after these medicines showed that the patient improved I have reviewed the patients home medicines and have made adjustments as needed   Test Considered:  mri   Problem List / ED Course:  Back pain:  likely sciatica.  Labs are as expected with diamox.  Pt is encouraged to f/u with neuro.   Reevaluation:  After the interventions noted above, I reevaluated the patient and found that they have :improved   Social Determinants of Health:  Lives at home   Dispostion:  After consideration of the diagnostic  results and the patients response to treatment, I feel that the patent would benefit from discharge with outpatient f/u.  Final Clinical Impression(s) / ED Diagnoses Final diagnoses:  Sciatica of right side  IIH (idiopathic intracranial hypertension)    Rx / DC Orders ED Discharge Orders          Ordered    predniSONE (DELTASONE) 50 MG tablet  Daily with breakfast        03/13/22 1510    celecoxib (CELEBREX) 200 MG capsule  2 times daily        03/13/22 1512              Isla Pence, MD 03/13/22 1513

## 2022-03-13 NOTE — ED Provider Triage Note (Signed)
Emergency Medicine Provider Triage Evaluation Note  Kaitlin Nelson , a 27 y.o. female  was evaluated in triage.  Pt complains of tingling to bilateral feet and pain x1 week since after spinal tap. Denies any leg pain, endorses slight back tightness after spinal tap. Also states intermittent tingling of face. Feet pain was initially only at night now more frequent and occurring in daytime.  Review of Systems  Positive: paresthesias Negative: Vision changes, headache, weakness  Physical Exam  There were no vitals taken for this visit. Gen:   Awake, no distress   Resp:  Normal effort  MSK:   Moves extremities without difficulty  Other:  No neurodeficits  Medical Decision Making  Medically screening exam initiated at 10:08 AM.  Appropriate orders placed.  Kaitlin Nelson was informed that the remainder of the evaluation will be completed by another provider, this initial triage assessment does not replace that evaluation, and the importance of remaining in the ED until their evaluation is complete.    Kaitlin Nelson, [place] 03/13/22 1012

## 2022-03-13 NOTE — ED Triage Notes (Signed)
Pt reports spinal tap on 03/08/2022 with bilateral leg numbness since.

## 2022-03-14 DIAGNOSIS — R519 Headache, unspecified: Secondary | ICD-10-CM | POA: Diagnosis not present

## 2022-04-03 ENCOUNTER — Encounter: Payer: Self-pay | Admitting: Neurology

## 2022-04-03 ENCOUNTER — Other Ambulatory Visit: Payer: Self-pay | Admitting: Neurology

## 2022-04-03 DIAGNOSIS — G932 Benign intracranial hypertension: Secondary | ICD-10-CM

## 2022-04-04 ENCOUNTER — Telehealth: Payer: Self-pay

## 2022-04-04 NOTE — Telephone Encounter (Signed)
Mychart msg sent. AS, CMA 

## 2022-04-23 ENCOUNTER — Encounter: Payer: Self-pay | Admitting: Internal Medicine

## 2022-04-25 ENCOUNTER — Ambulatory Visit
Admission: RE | Admit: 2022-04-25 | Discharge: 2022-04-25 | Disposition: A | Discharge status: Home / Self Care | Payer: BC Managed Care – PPO | Source: Ambulatory Visit | Attending: Neurology | Admitting: Neurology

## 2022-04-25 DIAGNOSIS — G932 Benign intracranial hypertension: Secondary | ICD-10-CM

## 2022-05-10 ENCOUNTER — Inpatient Hospital Stay: Admission: RE | Admit: 2022-05-10 | Payer: BC Managed Care – PPO | Source: Ambulatory Visit

## 2022-05-24 ENCOUNTER — Ambulatory Visit
Admission: RE | Admit: 2022-05-24 | Discharge: 2022-05-24 | Disposition: A | Discharge status: Home / Self Care | Payer: BC Managed Care – PPO | Source: Ambulatory Visit | Attending: Neurology | Admitting: Neurology

## 2022-05-24 DIAGNOSIS — G932 Benign intracranial hypertension: Secondary | ICD-10-CM | POA: Diagnosis not present

## 2022-07-02 ENCOUNTER — Ambulatory Visit (HOSPITAL_COMMUNITY)
Admission: EM | Admit: 2022-07-02 | Discharge: 2022-07-03 | Disposition: A | Discharge status: Other Acute Inpatient Hospital | Payer: BC Managed Care – PPO | Attending: Psychiatry | Admitting: Psychiatry

## 2022-07-02 DIAGNOSIS — F319 Bipolar disorder, unspecified: Secondary | ICD-10-CM | POA: Diagnosis present

## 2022-07-02 DIAGNOSIS — F3132 Bipolar disorder, current episode depressed, moderate: Secondary | ICD-10-CM

## 2022-07-02 DIAGNOSIS — Z818 Family history of other mental and behavioral disorders: Secondary | ICD-10-CM | POA: Diagnosis not present

## 2022-07-02 DIAGNOSIS — F419 Anxiety disorder, unspecified: Secondary | ICD-10-CM | POA: Insufficient documentation

## 2022-07-02 DIAGNOSIS — R45851 Suicidal ideations: Secondary | ICD-10-CM | POA: Diagnosis not present

## 2022-07-02 DIAGNOSIS — Z79899 Other long term (current) drug therapy: Secondary | ICD-10-CM | POA: Diagnosis not present

## 2022-07-02 LAB — URINALYSIS, COMPLETE (UACMP) WITH MICROSCOPIC
Bilirubin Urine: NEGATIVE
Glucose, UA: NEGATIVE mg/dL
Hgb urine dipstick: NEGATIVE
Ketones, ur: NEGATIVE mg/dL
Nitrite: NEGATIVE
Protein, ur: NEGATIVE mg/dL
Specific Gravity, Urine: 1.012 (ref 1.005–1.030)
pH: 5 (ref 5.0–8.0)

## 2022-07-02 LAB — COMPREHENSIVE METABOLIC PANEL
ALT: 13 U/L (ref 0–44)
AST: 16 U/L (ref 15–41)
Albumin: 4.1 g/dL (ref 3.5–5.0)
Alkaline Phosphatase: 60 U/L (ref 38–126)
Anion gap: 11 (ref 5–15)
BUN: 14 mg/dL (ref 6–20)
CO2: 22 mmol/L (ref 22–32)
Calcium: 9.4 mg/dL (ref 8.9–10.3)
Chloride: 106 mmol/L (ref 98–111)
Creatinine, Ser: 0.93 mg/dL (ref 0.44–1.00)
GFR, Estimated: >60 mL/min (ref >60)
Glucose, Bld: 106 mg/dL — ABNORMAL HIGH (ref 70–99)
Potassium: 3.8 mmol/L (ref 3.5–5.1)
Sodium: 139 mmol/L (ref 135–145)
Total Bilirubin: 0.9 mg/dL (ref 0.3–1.2)
Total Protein: 7 g/dL (ref 6.5–8.1)

## 2022-07-02 LAB — CBC WITH DIFFERENTIAL/PLATELET
Abs Immature Granulocytes: 0.01 10*3/uL (ref 0.00–0.07)
Basophils Absolute: 0 10*3/uL (ref 0.0–0.1)
Basophils Relative: 0 %
Eosinophils Absolute: 0.1 10*3/uL (ref 0.0–0.5)
Eosinophils Relative: 1 %
HCT: 41.4 % (ref 36.0–46.0)
Hemoglobin: 13.3 g/dL (ref 12.0–15.0)
Immature Granulocytes: 0 %
Lymphocytes Relative: 37 %
Lymphs Abs: 2.2 10*3/uL (ref 0.7–4.0)
MCH: 30 pg (ref 26.0–34.0)
MCHC: 32.1 g/dL (ref 30.0–36.0)
MCV: 93.5 fL (ref 80.0–100.0)
Monocytes Absolute: 0.4 10*3/uL (ref 0.1–1.0)
Monocytes Relative: 6 %
Neutro Abs: 3.3 10*3/uL (ref 1.7–7.7)
Neutrophils Relative %: 56 %
Platelets: 237 10*3/uL (ref 150–400)
RBC: 4.43 MIL/uL (ref 3.87–5.11)
RDW: 13 % (ref 11.5–15.5)
WBC: 6 10*3/uL (ref 4.0–10.5)
nRBC: 0 % (ref 0.0–0.2)

## 2022-07-02 LAB — POCT URINE DRUG SCREEN - MANUAL ENTRY (I-SCREEN)
POC Amphetamine UR: NOT DETECTED
POC Buprenorphine (BUP): NOT DETECTED
POC Cocaine UR: NOT DETECTED
POC Marijuana UR: POSITIVE — AB
POC Methadone UR: NOT DETECTED
POC Methamphetamine UR: NOT DETECTED
POC Morphine: NOT DETECTED
POC Oxazepam (BZO): NOT DETECTED
POC Oxycodone UR: NOT DETECTED
POC Secobarbital (BAR): NOT DETECTED

## 2022-07-02 LAB — LIPID PANEL
Cholesterol: 154 mg/dL (ref 0–200)
HDL: 45 mg/dL (ref >40)
LDL Cholesterol: 97 mg/dL (ref 0–99)
Total CHOL/HDL Ratio: 3.4 RATIO
Triglycerides: 59 mg/dL (ref <150)
VLDL: 12 mg/dL (ref 0–40)

## 2022-07-02 LAB — HEMOGLOBIN A1C
Hgb A1c MFr Bld: 5 % (ref 4.8–5.6)
Mean Plasma Glucose: 96.8 mg/dL

## 2022-07-02 LAB — TSH: TSH: 0.774 u[IU]/mL (ref 0.350–4.500)

## 2022-07-02 LAB — MAGNESIUM: Magnesium: 2.1 mg/dL (ref 1.7–2.4)

## 2022-07-02 LAB — POCT PREGNANCY, URINE: Preg Test, Ur: NEGATIVE

## 2022-07-02 LAB — ETHANOL: Alcohol, Ethyl (B): <10 mg/dL (ref <10)

## 2022-07-02 MED ORDER — ALUM & MAG HYDROXIDE-SIMETH 200-200-20 MG/5ML PO SUSP: 30.0000 mL | ORAL | Status: DC | PRN

## 2022-07-02 MED ORDER — ALBUTEROL SULFATE HFA 108 (90 BASE) MCG/ACT IN AERS: 2.0000 | INHALATION_SPRAY | Freq: Four times a day (QID) | RESPIRATORY_TRACT | Status: DC | PRN

## 2022-07-02 MED ORDER — ONDANSETRON HCL 4 MG PO TABS: 4.0000 mg | ORAL_TABLET | Freq: Four times a day (QID) | ORAL | Status: DC | PRN

## 2022-07-02 MED ORDER — TRAZODONE HCL 50 MG PO TABS
50.0000 mg | ORAL_TABLET | Freq: Every evening | ORAL | Status: DC | PRN
  Administered 2022-07-02: 50 mg via ORAL
  Filled 2022-07-02: qty 1

## 2022-07-02 MED ORDER — MAGNESIUM HYDROXIDE 400 MG/5ML PO SUSP: 30.0000 mL | Freq: Every day | ORAL | Status: DC | PRN

## 2022-07-02 MED ORDER — ASPIRIN-ACETAMINOPHEN-CAFFEINE 250-250-65 MG PO TABS: 2.0000 | ORAL_TABLET | Freq: Four times a day (QID) | ORAL | Status: DC | PRN

## 2022-07-02 MED ORDER — LAMOTRIGINE 150 MG PO TABS
150.0000 mg | ORAL_TABLET | Freq: Every day | ORAL | Status: DC
  Administered 2022-07-02: 150 mg via ORAL
  Filled 2022-07-02: qty 1

## 2022-07-02 MED ORDER — ACETAZOLAMIDE 250 MG PO TABS
500.0000 mg | ORAL_TABLET | Freq: Every day | ORAL | Status: DC
  Filled 2022-07-02 (×2): qty 2

## 2022-07-02 MED ORDER — LURASIDONE HCL 20 MG PO TABS
20.0000 mg | ORAL_TABLET | Freq: Every day | ORAL | Status: DC
  Administered 2022-07-02: 20 mg via ORAL
  Filled 2022-07-02: qty 1
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
## 2022-07-02 NOTE — Progress Notes (Signed)
Received Kaitlin Nelson in the exam room with a MHT, the skin assessment was completed and she relocated to the OBS unit. She was oriented to her new environment and offered nourishments. She continues to endorse feeling anxious and depressed.

## 2022-07-02 NOTE — Progress Notes (Signed)
   07/02/22 1500  [facility] Triage Screening (Walk-ins at [facility] only)  How Did You Hear About [place]? Legal System  What Is the Reason for Your Visit/Call Today? Pt presents to [facility] voluntarily escorted by [facility] police due to SI with a plan to either overdose on medicine or drive her car off the road. Pt reports SI for the past 2 months. Pt reports Bipolar II disorder, anxiety and depression. Pt reports she receives therapy and psychiatry services through [facility]. Pt reports she is prescribed Lexapro 150mg  and Latuda 15mg . Pt states her next appointment with her therapist is Friday 5/10. Pt states she does not feel like she can keep herself safe.Pt denies HI and AVH.  How Long Has This Been Causing You Problems? 1-6 months  Have You Recently Had Any Thoughts About Hurting Yourself? Yes  How long ago did you have thoughts about hurting yourself? today  Are You Planning to Commit Suicide/Harm Yourself At This time? Yes  Have you Recently Had Thoughts About Hurting Someone Karolee Ohs? No  Are You Planning To Harm Someone At This Time? No  Are you currently experiencing any auditory, visual or other hallucinations? No  Have You Used Any Alcohol or Drugs in the Past 24 Hours? No  Do you have any current medical co-morbidities that require immediate attention? No  Clinician description of patient physical appearance/behavior: cooperative, calm, casually dressed  What Do You Feel Would Help You the Most Today? Treatment for Depression or other mood problem  If access to [facility] was not available, would you have sought care in the Emergency Department? No  Determination of Need Urgent (48 hours)  Options For Referral Outpatient Therapy;Medication Management

## 2022-07-02 NOTE — Progress Notes (Signed)
   07/02/22 1500  BHUC Triage Screening (Walk-ins at [facility] only)  How Did You Hear About [place]? Legal System  What Is the Reason for Your Visit/Call Today? Pt presents to [facility] by [facility] police due to SI with a plan to either overdose on medicine or drive her car off the road. Pt reports SI for the past 2 months. Pt reports suicide attempt in 2015 where she attempted to overdose on medication, she states she went to the hospital to have her stomach pumped and then she was referred to outpatient. Pt reports Bipolar II disorder, anxiety and depression. Pt reports she receives therapy and psychiatry services through [facility]. Pt reports she is prescribed Lexapro 150mg  and Latuda 15mg . Pt states her next appointment with her therapist is Friday 5/10. Pt states she does not feel like she can keep herself safe.Pt denies HI and AVH.  How Long Has This Been Causing You Problems? 1-6 months  Have You Recently Had Any Thoughts About Hurting Yourself? Yes  How long ago did you have thoughts about hurting yourself? today  Are You Planning to Commit Suicide/Harm Yourself At This time? Yes  Have you Recently Had Thoughts About Hurting Someone Karolee Ohs? No  Are You Planning To Harm Someone At This Time? No  Are you currently experiencing any auditory, visual or other hallucinations? No  Have You Used Any Alcohol or Drugs in the Past 24 Hours? No  Do you have any current medical co-morbidities that require immediate attention? No  Clinician description of patient physical appearance/behavior: cooperative, calm, casually dressed  What Do You Feel Would Help You the Most Today? Treatment for Depression or other mood problem  If access to [facility]gent Care was not available, would you have sought care in the Emergency Department? No  Determination of Need Urgent (48 hours)  Options For Referral Outpatient Therapy;Medication Management

## 2022-07-02 NOTE — ED Notes (Signed)
Pt sleeping@this time. Breathing even and unlabored. Will continue to monitor for safety 
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
## 2022-07-03 DIAGNOSIS — F3132 Bipolar disorder, current episode depressed, moderate: Secondary | ICD-10-CM | POA: Diagnosis not present

## 2022-07-03 LAB — POC SARS CORONAVIRUS 2 AG: SARSCOV2ONAVIRUS 2 AG: NEGATIVE

## 2022-07-03 MED ORDER — NICOTINE POLACRILEX 2 MG MT GUM: 2.0000 mg | CHEWING_GUM | OROMUCOSAL | Status: DC | PRN

## 2022-07-03 MED ORDER — NICOTINE 14 MG/24HR TD PT24
14.0000 mg | MEDICATED_PATCH | Freq: Every day | TRANSDERMAL | Status: DC
  Administered 2022-07-03: 14 mg via TRANSDERMAL
  Filled 2022-07-03: qty 1

## 2022-07-03 MED ORDER — NICOTINE 21 MG/24HR TD PT24: 21.0000 mg | MEDICATED_PATCH | Freq: Every day | TRANSDERMAL | Status: DC

## 2022-07-03 MED ORDER — IBUPROFEN 400 MG PO TABS
400.0000 mg | ORAL_TABLET | Freq: Four times a day (QID) | ORAL | Status: DC | PRN
  Administered 2022-07-03: 400 mg via ORAL
  Filled 2022-07-03: qty 1

## 2022-07-03 MED ORDER — IBUPROFEN 400 MG PO TABS: 400.0000 mg | ORAL_TABLET | Freq: Four times a day (QID) | ORAL | 0 refills | Status: DC | PRN

## 2022-07-03 NOTE — Progress Notes (Signed)
Pt is under review at [facility] PENDING acceptance negative COVID via fax to [phone] and Intake nurse has requested to speak with pt's assigned RN once negative COVID has result at [phone]; Annice Pih with Intake.  -1st shift CSW Disposition to follow up with facility and care team.  Care Team notified: Disposition Cathie Beams, LCSWA, Bedelia Person, RN, Vernard Gambles, NP, Hansel Starling, RN, [facility] AC Malva Limes, RN   Maryjean Ka, MSW, [facility] 07/03/2022 12:52 AM

## 2022-07-03 NOTE — ED Notes (Signed)
Pt resting quietly, breathing is even and unlabored.  Pt denies SI, HI, and AVH. Reports lower back pain 5/10, due to sleeping in recliner chair at facility.  Pt will be transferring to [facility].  Report attempted to facility but facility refused to take report until pt was in [employer]. Safe transport called and arranged. Will continue to monitor for safety.

## 2022-07-03 NOTE — Progress Notes (Signed)
LCSW Progress Note  [id number]   Ninja Sigurdson  07/03/2022  12:27 AM    Inpatient Behavioral Health Placement  Pt meets inpatient criteria per Vernard Gambles, NP. There are no available beds within [facility] system per [facility] AC Rosey Bath, RN. Referral was sent to the following facilities;   Destination  Service Provider Address Phone Fax  [facility]  [address] [place] [address] [phone]  [facility]  [address] [place] [address] [phone]  [facility] [address]  [address] [place] [address] [phone]  [facility]  [address] [place], [place] [address] [phone]  [facility]  [address] [place] [address] [phone]  [facility]  [address] [place] [address] [phone]  [facility]  [address] [place] [address] [phone]  [facility]  [address], [place] [address] [phone]  [facility]  [address] [place] [address] [phone]  [facility]  [address], [place] [address] [phone]  [facility]  [address], [place] [address] [phone]  [facility]  [address], [place] [address] [phone]  [facility]  [address] [place] [address] [phone]  [facility]  [address] [place] [address] [phone]  [facility]  [address], [place] [address] [phone]  [facility]  [address], [place] [address] [phone]   [facility]  [address] [place] [address] [phone]  [facility]  [address] [place], [place] [address] [phone]  [facility]  [address] [place] [address] [phone]  [facility] [address] [place]  [address] [place], [place] [address] [phone]  [facility] [address] [place]  [address], [place] [address] [phone]  [facility] [place] [address] [phone]  [facility]  [address], [place] [address] [phone]  [facility]  [address] [place] [address] [phone]  [facility]  [address] [place] [address] [phone]    Situation ongoing,  CSW will follow up.    Maryjean Ka, MSW, [facility] 07/03/2022 12:27 AM

## 2022-07-03 NOTE — ED Notes (Signed)
Pt sleeping@this time. Breathing even and unlabored. Will continue to monitor for safety 

## 2022-07-03 NOTE — ED Provider Notes (Signed)
FBC/OBS ASAP Discharge Summary  Date and Time: 07/03/2022 4:18 PM  Name: Kaitlin Nelson  MRN:  [id number]   Discharge Diagnoses:  Final diagnoses:  Bipolar affective disorder, depressed, moderate (HCC)  HPI:  "Kaitlin Nelson is a 27 y.o. female patient presented to [facility] as a walk in accompanied by [facility] police with complaints of increased depression, anxiety, and SI.  She had expressed her suicidal ideations to her therapist at [facility] today and at therapist referral patient presents to [facility] for assessment.. she was admitted to the continuous assessment unit while awaiting IP bed availability.   Per chart review patient has a past psychiatric history of bipolar affective disorder with depression, GAD, and PTSD.  She currently has services in place for medication management and therapy with [facility].  Her therapist is a [facility] and she sees Ann Maki NP for medications.  She is prescribed Latuda 20 mg nightly and Lamictal 150 mg nightly.  She reports compliance with medications.  She endorses occasional alcohol and marijuana use.  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   Past medication trials include BuSpar which induces nightmares, Atarax which gives her night terrors, Lexapro worsens her depression, and propranolol which she finds not effective". Admission note from this writer on 07/02/2022  Kaitlin Nelson, 27 y.o., female patient seen face to face by this provider, consulted with Dr. Lucianne Muss; and chart reviewed on 07/03/22  Subjective:   On today's evaluation patient has been observed sitting in her bed awake.  She is alert/oriented.  She reports getting little sleep due to the lights and noise on the unit.  She continues to endorse depression and suicidal ideations.  She cannot contract for safety.  She denies SI/HI.  She does not appear to be responding to  internal/external stimuli.  She reports little change from her admission presentation.   Stay Summary: Patient continues to meet criteria for inpatient psychiatric admission.  She has been accepted to [facility].  Patient is agreeable transferred via safe transport.   Total Time spent with patient: 30 minutes  Past Psychiatric History: see h&P Past Medical History: see h&P Family History: see h&P Family Psychiatric History: see h&P Social History: see h&P Tobacco Cessation:  N/A, patient does not currently use tobacco products  Current Medications:  No current facility-administered medications for this encounter.   Current Outpatient Medications  Medication Sig Dispense Refill   acetaZOLAMIDE (DIAMOX) 250 MG tablet Take 2 tablets (500 mg total) by mouth in the morning and at bedtime. 60 tablet 0   albuterol (VENTOLIN HFA) 108 (90 Base) MCG/ACT inhaler Inhale 2 puffs into the lungs every 6 (six) hours as needed for wheezing or shortness of breath.     EXCEDRIN MIGRAINE 250-250-65 MG tablet Take 1-2 tablets by mouth every 6 (six) hours as needed for migraine.     lamoTRIgine (LAMICTAL) 150 MG tablet Take 150 mg by mouth at bedtime.     lurasidone (LATUDA) 20 MG TABS tablet Take 20 mg by mouth at bedtime.     ondansetron (ZOFRAN) 4 MG tablet Take 1 tablet (4 mg total) by mouth every 6 (six) hours as needed for nausea or vomiting. 20 tablet 0   ibuprofen (ADVIL) 400 MG tablet Take 1 tablet (400 mg total) by mouth every 6 (six) hours as needed for mild pain or moderate pain. 30 tablet  0    PTA Medications:  PTA Medications  Medication Sig   albuterol (VENTOLIN HFA) 108 (90 Base) MCG/ACT inhaler Inhale 2 puffs into the lungs every 6 (six) hours as needed for wheezing or shortness of breath.   lurasidone (LATUDA) 20 MG TABS tablet Take 20 mg by mouth at bedtime.   EXCEDRIN MIGRAINE 250-250-65 MG tablet Take 1-2 tablets by mouth every 6 (six) hours as needed for migraine.    acetaZOLAMIDE (DIAMOX) 250 MG tablet Take 2 tablets (500 mg total) by mouth in the morning and at bedtime.   ondansetron (ZOFRAN) 4 MG tablet Take 1 tablet (4 mg total) by mouth every 6 (six) hours as needed for nausea or vomiting.   lamoTRIgine (LAMICTAL) 150 MG tablet Take 150 mg by mouth at bedtime.   ibuprofen (ADVIL) 400 MG tablet Take 1 tablet (400 mg total) by mouth every 6 (six) hours as needed for mild pain or moderate pain.       01/26/2021    2:24 PM 10/12/2020    1:30 PM 06/06/2020    8:27 AM  Depression screen PHQ 2/9  Decreased Interest 1 3 2   Down, Depressed, Hopeless 2 2 2   PHQ - 2 Score 3 5 4   Altered sleeping 2 3 3   Tired, decreased energy 1 3 2   Change in appetite 3 2 3   Feeling bad or failure about yourself  1 2 2   Trouble concentrating 3 2 2   Moving slowly or fidgety/restless 1 1 1   Suicidal thoughts 2 2 1   PHQ-9 Score 16 20 18   Difficult doing work/chores Very difficult Very difficult Very difficult    Flowsheet Row ED from 07/02/2022 in [facility] ED from 03/07/2022 in [facility] at [facility] ED from 11/23/2021 in [facility] at [facility])  C-SSRS RISK CATEGORY Moderate Risk No Risk No Risk       Musculoskeletal  Strength & Muscle Tone: within normal limits Gait & Station: normal Patient leans: N/A  Psychiatric Specialty Exam  Presentation  General Appearance:  Appropriate for Environment; Casual  Eye Contact: Good  Speech: Clear and Coherent; Normal Rate  Speech Volume: Normal  Handedness:Right   Mood and Affect  Mood: Anxious; Depressed  Affect: Congruent   Thought Process  Thought Processes: Coherent  Descriptions of Associations:Intact  Orientation:Full (Time, Place and Person)  Thought Content:Logical  Diagnosis of Schizophrenia or Schizoaffective disorder in past: No    Hallucinations:Hallucinations: None  Ideas of  Reference:None  Suicidal Thoughts:Suicidal Thoughts: Yes, Active SI Active Intent and/or Plan: With Intent; With Plan; With Means to Carry Out  Homicidal Thoughts:Homicidal Thoughts: No   Sensorium  Memory: Immediate Good; Recent Good; Remote Good  Judgment: Fair  Insight: Fair   Risk manager  Attention Span: Good  Recall: Good  Fund of Knowledge: Good  Language: Good   Psychomotor Activity  Psychomotor Activity: Psychomotor Activity: Normal   Assets  Assets: Communication Skills; Desire for Improvement; Financial Resources/Insurance; Housing; Physical Health; Resilience; Social Support   Sleep  Sleep: Sleep: Poor Number of Hours of Sleep: 5   Nutritional Assessment (For OBS and FBC admissions only) Has the patient had a weight loss or gain of 10 pounds or more in the last 3 months?: No Has the patient had a decrease in food intake/or appetite?: No Does the patient have dental problems?: No Does the patient have eating habits or behaviors that may be indicators of an  eating disorder including binging or inducing vomiting?: No Has the patient recently lost weight without trying?: 0 Has the patient been eating poorly because of a decreased appetite?: 1 Malnutrition Screening Tool Score: 1    Physical Exam  Physical Exam Vitals and nursing note reviewed.  Constitutional:      General: She is not in acute distress.    Appearance: Normal appearance. She is well-developed.  Eyes:     General:        Right eye: No discharge.        Left eye: No discharge.     Pupils: Pupils are equal, round, and reactive to light.  Pulmonary:     Effort: Pulmonary effort is normal. No respiratory distress.  Musculoskeletal:        General: No swelling. Normal range of motion.     Cervical back: Normal range of motion.  Neurological:     Mental Status: She is alert.  Psychiatric:        Attention and Perception: Attention and  perception normal.        Mood and Affect: Affect normal. Mood is depressed.        Speech: Speech normal.        Behavior: Behavior is cooperative.        Thought Content: Thought content normal.        Cognition and Memory: Cognition normal.        Judgment: Judgment normal.    Review of Systems  Constitutional: Negative.   HENT: Negative.    Eyes: Negative.   Respiratory: Negative.    Cardiovascular: Negative.   Musculoskeletal: Negative.   Skin: Negative.   Neurological: Negative.   Psychiatric/Behavioral:  Positive for depression and suicidal ideas.    Blood pressure 94/74, pulse 96, temperature 97.9 F (36.6 C), resp. rate 18, SpO2 98 %. There is no height or weight on file to calculate BMI.    Disposition: Discharge and transfer patient to [facility] for IP admission.   Ardis Hughs, NP 07/03/2022, 4:18 PM

## 2022-07-03 NOTE — Discharge Instructions (Addendum)
Transfer to [facility], Dr. Haze Rushing accepting md
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
## 2022-08-22 ENCOUNTER — Encounter (HOSPITAL_COMMUNITY): Payer: Self-pay | Admitting: Psychiatry

## 2022-08-22 ENCOUNTER — Ambulatory Visit (INDEPENDENT_AMBULATORY_CARE_PROVIDER_SITE_OTHER): Payer: BC Managed Care – PPO | Admitting: Psychiatry

## 2022-08-22 DIAGNOSIS — F1491 Cocaine use, unspecified, in remission: Secondary | ICD-10-CM | POA: Diagnosis not present

## 2022-08-22 DIAGNOSIS — F4310 Post-traumatic stress disorder, unspecified: Secondary | ICD-10-CM | POA: Diagnosis not present

## 2022-08-22 DIAGNOSIS — F3132 Bipolar disorder, current episode depressed, moderate: Secondary | ICD-10-CM

## 2022-08-22 DIAGNOSIS — F411 Generalized anxiety disorder: Secondary | ICD-10-CM

## 2022-08-22 DIAGNOSIS — F1211 Cannabis abuse, in remission: Secondary | ICD-10-CM

## 2022-08-22 MED ORDER — GABAPENTIN 100 MG PO CAPS: 100.0000 mg | ORAL_CAPSULE | Freq: Two times a day (BID) | ORAL | 2 refills | Status: AC | PRN

## 2022-08-22 MED ORDER — LAMOTRIGINE 100 MG PO TABS: ORAL_TABLET | ORAL | 2 refills | Status: AC

## 2022-08-22 MED ORDER — CLONIDINE HCL 0.1 MG PO TABS: 0.1000 mg | ORAL_TABLET | Freq: Every evening | ORAL | 2 refills | Status: AC

## 2022-08-22 MED ORDER — LURASIDONE HCL 20 MG PO TABS: 20.0000 mg | ORAL_TABLET | Freq: Every day | ORAL | 2 refills | Status: AC

## 2022-08-22 NOTE — Patient Instructions (Signed)
Thank you for attending your appointment today.  -- INCREASE lamotrigine to 150 mg daily + 200 mg nightly -- Continue Latuda 20 mg daily -- START clonidine 0.1 mg nightly -- START gabapentin 100 mg twice daily as needed for anxiety  Please do not make any changes to medications without first discussing with your provider. If you are experiencing a psychiatric emergency, please call 911 or present to your nearest emergency department. Additional crisis, medication management, and therapy resources are included below.  [facility]  [address], [place], [place] [address] [phone] WALK-IN URGENT CARE 24/7 FOR ANYONE [address] [place], [place]  [phone] Fax: [phone] [email or web address] *Interpreters available *Accepts all insurance and uninsured for Urgent Care needs *Accepts Medicaid and uninsured for outpatient treatment (below)      ONLY FOR [facility]  Below:    Outpatient New Patient Assessment/Therapy Walk-ins:        Monday -Thursday 8am until slots are full.        Every Friday 1pm-4pm  (first come, first served)                   New Patient Psychiatry/Medication Management        Monday-Friday 8am-11am (first come, first served)               For all walk-ins we ask that you arrive by 7:15am, because patients will be seen in the order of arrival.

## 2022-08-23 ENCOUNTER — Telehealth (HOSPITAL_COMMUNITY): Payer: Self-pay

## 2022-08-23 NOTE — Telephone Encounter (Signed)
This patient called and left a voicemail at our office stating that she needs to speak with you about a medication you just prescribed. Patient left a phone number of [phone]

## 2022-09-24 ENCOUNTER — Encounter (HOSPITAL_COMMUNITY): Payer: Self-pay | Admitting: Psychiatry

## 2022-09-24 ENCOUNTER — Telehealth (INDEPENDENT_AMBULATORY_CARE_PROVIDER_SITE_OTHER): Payer: BC Managed Care – PPO | Admitting: Psychiatry

## 2022-09-24 DIAGNOSIS — F3132 Bipolar disorder, current episode depressed, moderate: Secondary | ICD-10-CM

## 2022-09-24 DIAGNOSIS — F1211 Cannabis abuse, in remission: Secondary | ICD-10-CM

## 2022-09-24 DIAGNOSIS — F1491 Cocaine use, unspecified, in remission: Secondary | ICD-10-CM | POA: Diagnosis not present

## 2022-09-24 DIAGNOSIS — F4310 Post-traumatic stress disorder, unspecified: Secondary | ICD-10-CM | POA: Diagnosis not present

## 2022-09-24 DIAGNOSIS — F411 Generalized anxiety disorder: Secondary | ICD-10-CM

## 2022-09-24 NOTE — Patient Instructions (Addendum)
Thank you for attending your appointment today.  -- We did not make any medication changes today. Please continue medications as prescribed. -- As discussed, we did not schedule a follow-up appointment. A great resource for finding a psychiatrist in the community is psychologytoday.com where you can filter by speciality, insurance, location, etc. -- You have 2 refills on your medications that should bridge you until you find a new provider; if you are in need of additional refills before you see your new provider please reach out to our clinic to make an appointment.   Please do not make any changes to medications without first discussing with your provider. If you are experiencing a psychiatric emergency, please call 911 or present to your nearest emergency department. Additional crisis, medication management, and therapy resources are included below.  [facility]  [address], [place], [place] [address] [phone] WALK-IN URGENT CARE 24/7 FOR ANYONE [address] [place], [place]  [phone] Fax: [phone] [email or web address] *Interpreters available *Accepts all insurance and uninsured for Urgent Care needs *Accepts Medicaid and uninsured for outpatient treatment (below)      ONLY FOR [facility]  Below:    Outpatient New Patient Assessment/Therapy Walk-ins:        Monday -Thursday 8am until slots are full.        Every Friday 1pm-4pm  (first come, first served)                   New Patient Psychiatry/Medication Management        Monday-Friday 8am-11am (first come, first served)               For all walk-ins we ask that you arrive by 7:15am, because patients will be seen in the order of arrival.
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
## 2022-10-10 ENCOUNTER — Telehealth (HOSPITAL_COMMUNITY): Payer: BC Managed Care – PPO | Admitting: Psychiatry
